# Patient Record
Sex: Female | Born: 1960 | Race: White | Hispanic: No | Marital: Married | Smoking: Never smoker
Health system: Southern US, Community
[De-identification: ages and names within clinical notes are randomized; demographics above are authoritative.]

## PROBLEM LIST (undated history)

## (undated) DIAGNOSIS — E669 Obesity, unspecified: Secondary | ICD-10-CM

## (undated) DIAGNOSIS — K5909 Other constipation: Secondary | ICD-10-CM

## (undated) DIAGNOSIS — F329 Major depressive disorder, single episode, unspecified: Secondary | ICD-10-CM

## (undated) DIAGNOSIS — G4733 Obstructive sleep apnea (adult) (pediatric): Principal | ICD-10-CM

## (undated) DIAGNOSIS — C801 Malignant (primary) neoplasm, unspecified: Secondary | ICD-10-CM

## (undated) DIAGNOSIS — E785 Hyperlipidemia, unspecified: Secondary | ICD-10-CM

## (undated) DIAGNOSIS — F32A Depression, unspecified: Secondary | ICD-10-CM

## (undated) DIAGNOSIS — K589 Irritable bowel syndrome without diarrhea: Secondary | ICD-10-CM

## (undated) DIAGNOSIS — F4010 Social phobia, unspecified: Secondary | ICD-10-CM

## (undated) HISTORY — DX: Hyperlipidemia, unspecified: E78.5

## (undated) HISTORY — DX: Obesity, unspecified: E66.9

## (undated) HISTORY — DX: Irritable bowel syndrome, unspecified: K58.9

## (undated) HISTORY — PX: TOTAL THYROIDECTOMY: SHX2547

## (undated) HISTORY — DX: Major depressive disorder, single episode, unspecified: F32.9

## (undated) HISTORY — DX: Obstructive sleep apnea (adult) (pediatric): G47.33

## (undated) HISTORY — PX: TOTAL ABDOMINAL HYSTERECTOMY: SHX209

## (undated) HISTORY — PX: CYSTOCELE REPAIR: SHX163

## (undated) HISTORY — DX: Social phobia, unspecified: F40.10

## (undated) HISTORY — PX: FOOT SURGERY: SHX648

## (undated) HISTORY — DX: Depression, unspecified: F32.A

## (undated) HISTORY — DX: Other constipation: K59.09

## (undated) HISTORY — DX: Malignant (primary) neoplasm, unspecified: C80.1

## (undated) HISTORY — PX: RECTOCELE REPAIR: SHX761

---

## 1997-10-14 ENCOUNTER — Other Ambulatory Visit: Admission: RE | Admit: 1997-10-14 | Discharge: 1997-10-14 | Payer: Self-pay | Admitting: Obstetrics & Gynecology

## 2000-03-22 DIAGNOSIS — K6389 Other specified diseases of intestine: Secondary | ICD-10-CM

## 2002-03-27 ENCOUNTER — Other Ambulatory Visit: Admission: RE | Admit: 2002-03-27 | Discharge: 2002-03-27 | Payer: Self-pay | Admitting: Obstetrics and Gynecology

## 2002-10-17 ENCOUNTER — Encounter: Payer: Self-pay | Admitting: Obstetrics and Gynecology

## 2002-10-17 ENCOUNTER — Ambulatory Visit (HOSPITAL_COMMUNITY): Admission: RE | Admit: 2002-10-17 | Discharge: 2002-10-17 | Payer: Self-pay | Admitting: Obstetrics and Gynecology

## 2003-04-03 ENCOUNTER — Other Ambulatory Visit: Admission: RE | Admit: 2003-04-03 | Discharge: 2003-04-03 | Payer: Self-pay | Admitting: Obstetrics and Gynecology

## 2003-04-10 ENCOUNTER — Encounter: Admission: RE | Admit: 2003-04-10 | Discharge: 2003-04-10 | Payer: Self-pay | Admitting: Obstetrics and Gynecology

## 2003-09-10 ENCOUNTER — Encounter: Admission: RE | Admit: 2003-09-10 | Discharge: 2003-09-10 | Payer: Self-pay | Admitting: Interventional Radiology

## 2003-11-06 ENCOUNTER — Encounter: Admission: RE | Admit: 2003-11-06 | Discharge: 2003-11-06 | Payer: Self-pay | Admitting: Obstetrics and Gynecology

## 2004-05-05 ENCOUNTER — Other Ambulatory Visit: Admission: RE | Admit: 2004-05-05 | Discharge: 2004-05-05 | Payer: Self-pay | Admitting: Obstetrics and Gynecology

## 2004-05-25 ENCOUNTER — Ambulatory Visit: Payer: Self-pay | Admitting: Gastroenterology

## 2004-05-28 ENCOUNTER — Ambulatory Visit (HOSPITAL_COMMUNITY): Admission: RE | Admit: 2004-05-28 | Discharge: 2004-05-28 | Payer: Self-pay | Admitting: Gastroenterology

## 2004-06-16 ENCOUNTER — Ambulatory Visit: Payer: Self-pay | Admitting: Gastroenterology

## 2004-06-23 ENCOUNTER — Ambulatory Visit: Payer: Self-pay | Admitting: Gastroenterology

## 2004-06-23 DIAGNOSIS — K449 Diaphragmatic hernia without obstruction or gangrene: Secondary | ICD-10-CM | POA: Insufficient documentation

## 2004-09-09 ENCOUNTER — Other Ambulatory Visit: Admission: RE | Admit: 2004-09-09 | Discharge: 2004-09-09 | Payer: Self-pay | Admitting: Obstetrics and Gynecology

## 2006-10-19 ENCOUNTER — Ambulatory Visit: Payer: Self-pay | Admitting: Internal Medicine

## 2006-10-24 ENCOUNTER — Ambulatory Visit (HOSPITAL_COMMUNITY): Admission: RE | Admit: 2006-10-24 | Discharge: 2006-10-24 | Payer: Self-pay | Admitting: Internal Medicine

## 2006-11-17 ENCOUNTER — Ambulatory Visit: Payer: Self-pay | Admitting: Internal Medicine

## 2006-11-28 ENCOUNTER — Ambulatory Visit: Payer: Self-pay | Admitting: Internal Medicine

## 2006-11-28 DIAGNOSIS — K5730 Diverticulosis of large intestine without perforation or abscess without bleeding: Secondary | ICD-10-CM | POA: Insufficient documentation

## 2007-06-05 DIAGNOSIS — R1310 Dysphagia, unspecified: Secondary | ICD-10-CM | POA: Insufficient documentation

## 2007-06-05 DIAGNOSIS — F329 Major depressive disorder, single episode, unspecified: Secondary | ICD-10-CM

## 2007-06-05 DIAGNOSIS — E785 Hyperlipidemia, unspecified: Secondary | ICD-10-CM

## 2007-06-05 DIAGNOSIS — K5900 Constipation, unspecified: Secondary | ICD-10-CM | POA: Insufficient documentation

## 2007-06-05 DIAGNOSIS — K219 Gastro-esophageal reflux disease without esophagitis: Secondary | ICD-10-CM | POA: Insufficient documentation

## 2007-07-28 ENCOUNTER — Telehealth: Payer: Self-pay | Admitting: Internal Medicine

## 2007-09-04 ENCOUNTER — Encounter: Admission: RE | Admit: 2007-09-04 | Discharge: 2007-09-04 | Payer: Self-pay | Admitting: Obstetrics and Gynecology

## 2007-09-07 DIAGNOSIS — K649 Unspecified hemorrhoids: Secondary | ICD-10-CM | POA: Insufficient documentation

## 2007-09-11 ENCOUNTER — Ambulatory Visit: Payer: Self-pay | Admitting: Internal Medicine

## 2007-09-26 ENCOUNTER — Encounter: Payer: Self-pay | Admitting: Internal Medicine

## 2007-09-29 ENCOUNTER — Ambulatory Visit: Payer: Self-pay | Admitting: Internal Medicine

## 2007-09-29 LAB — CONVERTED CEMR LAB
Fecal Occult Blood: NEGATIVE
OCCULT 5: NEGATIVE

## 2007-10-18 ENCOUNTER — Ambulatory Visit: Payer: Self-pay | Admitting: Internal Medicine

## 2007-10-22 ENCOUNTER — Encounter: Payer: Self-pay | Admitting: Internal Medicine

## 2009-02-12 ENCOUNTER — Emergency Department (HOSPITAL_COMMUNITY): Admission: EM | Admit: 2009-02-12 | Discharge: 2009-02-12 | Payer: Self-pay | Admitting: Emergency Medicine

## 2009-04-10 ENCOUNTER — Encounter (INDEPENDENT_AMBULATORY_CARE_PROVIDER_SITE_OTHER): Payer: Self-pay | Admitting: *Deleted

## 2009-04-21 ENCOUNTER — Encounter: Admission: RE | Admit: 2009-04-21 | Discharge: 2009-04-21 | Payer: Self-pay | Admitting: Obstetrics and Gynecology

## 2009-06-12 ENCOUNTER — Encounter
Admission: RE | Admit: 2009-06-12 | Discharge: 2009-06-12 | Payer: Self-pay | Admitting: Physical Medicine and Rehabilitation

## 2009-07-17 ENCOUNTER — Encounter: Admission: RE | Admit: 2009-07-17 | Discharge: 2009-07-17 | Payer: Self-pay | Admitting: Obstetrics and Gynecology

## 2010-03-31 NOTE — Letter (Signed)
Summary: Appointment - Missed  [facility], Main Office  [address]   [place], [place] [address]   Phone: [phone]  Fax: [phone]     April 10, 2009 MRN: [id number]   [facility] [address] [place], [place]  [address]   Dear Ms. CAWLEY,  Our records indicate you missed your appointment on  04/07/2009 with Dr. Tenny Craw. It is very important that we reach you to reschedule this appointment. We look forward to participating in your health care needs. Please contact us at the number listed above at your earliest convenience to reschedule this appointment.     Sincerely,   Migdalia Dk [facility] Scheduling Team

## 2010-07-14 NOTE — Assessment & Plan Note (Signed)
[facility]                         GASTROENTEROLOGY OFFICE NOTE   HARA, MILHOLLAND                        MRN:          [id number]  DATE:10/19/2006                            DOB:          06-26-1960    Ms. Tereasa Coop Mendonsa is a 50 year old white female who is here for  evaluation of constipation.  She was seen previously by Dr. Victorino Dike  and also by Dr. Russella Dar.  Colonoscopy in January 2002, showed normal colon  to the cecum with no polyps.  She was treated with Metamucil and milk of  magnesia.  She was subsequently seen by Dr. Victorino Dike in April 2006,  for symptoms of gastroesophageal reflux and an upper endoscopy confirmed  the presence of a hiatal hernia.  She now complains of progressive  constipation with severe problems with evacuation.  For the past 7-8  months she has not had any spontaneous bowel movements going without a  BM for about a week at a time.  Her weight has increased about 6 pounds.  She has been perimenopausal.  The patient has used health food brand of  stimulant, Triphalia, 1-2 pills a day.  She also uses Dulcolax tablets  once or twice a week which usually is effective within 24 hours of  taking it.  She tried fiber, MiraLax, Senokot.  Her eating habits are  regular, 3 meals a day.  She has a sedentary job at the computer.  She  denies rectal bleeding.   MEDICATIONS:  1. Lipitor 20 mg p.o. daily.  2. Prilosec OTC.  3. Nexium 40 mg p.o. daily.  4. Cymbalta.   PHYSICAL EXAMINATION:  VITAL SIGNS:  Blood pressure 102/66, pulse 92,  and weight 167 pounds.  GENERAL:  The patient was alert, oriented, and in no distress.  SKIN:  Warm and dry.  LUNGS:  Clear to auscultation.  COR:  With a normal S1, normal S2.  HEENT:  Thyroid was normal.  ABDOMEN:  Soft, nontender, not distended with normoactive bowel sounds.  Liver edge at the costal margin and no scars.  RECTAL:  Soft hemoccult negative stool.  Rectal tone was normal.   There  was no prolapse.   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  Rule out pelvic  floor dysfunction.  There was no clinical evidence for a rectal  prolapse.  Her life is quite sedentary which may be contributing to the  constipation.   PLAN:  1. Sitz Marks  to assess the colonic transit.  2. High fiber diet.  3. Resume MiraLax 17 grams daily in the morning and Senokot 2 tablets      at bedtime.  4. She will return in 6-8 weeks.     Hedwig Morton. Juanda Chance, MD  Electronically Signed    DMB/MedQ  DD: 10/20/2006  DT: 10/21/2006  Job #: [id number]   cc:   Dineen Kid. Rana Snare, M.D.

## 2010-10-27 ENCOUNTER — Other Ambulatory Visit: Payer: Self-pay | Admitting: Otolaryngology

## 2010-10-27 DIAGNOSIS — E039 Hypothyroidism, unspecified: Secondary | ICD-10-CM

## 2010-10-30 ENCOUNTER — Ambulatory Visit
Admission: RE | Admit: 2010-10-30 | Discharge: 2010-10-30 | Disposition: A | Discharge status: Home / Self Care | Payer: BC Managed Care – PPO | Source: Ambulatory Visit | Attending: Otolaryngology | Admitting: Otolaryngology

## 2010-10-30 DIAGNOSIS — E039 Hypothyroidism, unspecified: Secondary | ICD-10-CM

## 2010-11-10 ENCOUNTER — Other Ambulatory Visit: Payer: Self-pay | Admitting: Otolaryngology

## 2010-11-10 DIAGNOSIS — E041 Nontoxic single thyroid nodule: Secondary | ICD-10-CM

## 2010-11-16 ENCOUNTER — Other Ambulatory Visit (HOSPITAL_COMMUNITY)
Admission: RE | Admit: 2010-11-16 | Discharge: 2010-11-16 | Disposition: A | Discharge status: Home / Self Care | Payer: BC Managed Care – PPO | Source: Ambulatory Visit | Attending: Interventional Radiology | Admitting: Interventional Radiology

## 2010-11-16 ENCOUNTER — Ambulatory Visit
Admission: RE | Admit: 2010-11-16 | Discharge: 2010-11-16 | Disposition: A | Discharge status: Home / Self Care | Payer: BC Managed Care – PPO | Source: Ambulatory Visit | Attending: Otolaryngology | Admitting: Otolaryngology

## 2010-11-16 DIAGNOSIS — E041 Nontoxic single thyroid nodule: Secondary | ICD-10-CM

## 2010-11-16 DIAGNOSIS — E049 Nontoxic goiter, unspecified: Secondary | ICD-10-CM | POA: Insufficient documentation

## 2012-09-06 ENCOUNTER — Ambulatory Visit
Admission: RE | Admit: 2012-09-06 | Discharge: 2012-09-06 | Disposition: A | Discharge status: Home / Self Care | Payer: BC Managed Care – PPO | Source: Ambulatory Visit | Attending: Family Medicine | Admitting: Family Medicine

## 2012-09-06 ENCOUNTER — Other Ambulatory Visit: Payer: Self-pay | Admitting: Family Medicine

## 2012-09-06 DIAGNOSIS — K5900 Constipation, unspecified: Secondary | ICD-10-CM

## 2013-11-16 ENCOUNTER — Encounter: Payer: Self-pay | Admitting: *Deleted

## 2014-01-07 ENCOUNTER — Other Ambulatory Visit: Payer: Self-pay | Admitting: Dermatology

## 2014-06-14 ENCOUNTER — Encounter: Payer: Self-pay | Admitting: Interventional Cardiology

## 2014-06-14 ENCOUNTER — Ambulatory Visit (INDEPENDENT_AMBULATORY_CARE_PROVIDER_SITE_OTHER): Payer: BLUE CROSS/BLUE SHIELD | Admitting: Interventional Cardiology

## 2014-06-14 VITALS — BP 122/70 | HR 101 | Ht 66.0 in | Wt 186.8 lb

## 2014-06-14 DIAGNOSIS — R079 Chest pain, unspecified: Secondary | ICD-10-CM

## 2014-06-14 DIAGNOSIS — R0683 Snoring: Secondary | ICD-10-CM

## 2014-06-14 NOTE — Progress Notes (Signed)
Cardiology Office Note   Date:  06/14/2014   ID:  Jean Kelly, DOB 01-28-1961, MRN [id number]  PCP:  No primary care provider on file.  Cardiologist:   Sinclair Grooms, MD   Chief Complaint  Patient presents with  . Chest Pain      History of Present Illness: Jean Kelly is a 54 y.o. female who presents for evaluation of chest pain. She has had prior stress EKGs done in the past. She is cared for by Dr. Antony Contras. Over the past 4 months she has had 4 episodes of chest discomfort described as crushing. Episodes last 15-20 minutes. There is radiation into the neck and into each arm. Episodes have occurred during the daytime. Episodes gradually resolve. There is no sense of her heart racing or other complaints. She is pain-free currently. Last episode occurred 2 weeks ago. Intensity is 6/10.  Patient complains of a multiyear history of excessive daytime sleepiness and snoring. She states that the daytime sleepiness is particularly severe recently, with her falling asleep during a phone conference recently. She believes she awakened because she started snoring. The patient uses Adderall each morning to help her stay awake. She has done this for years.    Past Medical History  Diagnosis Date  . Depression   . Social phobia   . Chronic constipation   . Hyperlipidemia   . IBS (irritable bowel syndrome)   . Cancer     papillary thyroid    Past Surgical History  Procedure Laterality Date  . Total abdominal hysterectomy    . Rectocele repair    . Cystocele repair    . Foot surgery    . Total thyroidectomy       Current Outpatient Prescriptions  Medication Sig Dispense Refill  . Amphetamine-Dextroamphetamine (AMPHETAMINE SALT COMBO PO) Take 20 mg by mouth daily.     Marland Kitchen atorvastatin (LIPITOR) 40 MG tablet Take 40 mg by mouth daily.    Marland Kitchen co-enzyme Q-10 30 MG capsule Take 30 mg by mouth 3 (three) times daily.    Marland Kitchen docusate sodium (COLACE) 100 MG capsule Take 100 mg by  mouth 2 (two) times daily.    . DULoxetine (CYMBALTA) 30 MG capsule Take 30 mg by mouth 3 (three) times daily.     Marland Kitchen levothyroxine (SYNTHROID, LEVOTHROID) 137 MCG tablet Take 137 mcg by mouth daily before breakfast.    . Probiotic Product (PROBIOTIC DAILY PO) Take by mouth.     No current facility-administered medications for this visit.    Allergies:   Amoxicillin and Penicillins    Social History:  The patient  reports that she has never smoked. She does not have any smokeless tobacco history on file.   Family History:  The patient's  Father CAD with stentfamily history includes Lung disease in her mother; Parkinson's disease in her mother; Stroke in her father.    ROS:  Please see the history of present illness.   Otherwise, review of systems are positive for snoring and excessive daytime sleepiness. Other concerns include visual disturbance, abdominal discomfort, depression, muscle aches, rash, excessive sweating and fatigue, anxiety, and constipation.   All other systems are reviewed and negative.    PHYSICAL EXAM: VS:  BP 122/70 mmHg  Pulse 101  Ht 5\' 6"  (1.676 m)  Wt 186 lb 12.8 oz (84.732 kg)  BMI 30.16 kg/m2 , BMI Body mass index is 30.16 kg/(m^2). GEN: Well nourished, well developed, in no acute distress HEENT: normal  Neck: no JVD, carotid bruits, or masses Cardiac: RRR; no murmurs, rubs, or gallops,no edema  Respiratory:  clear to auscultation bilaterally, normal work of breathing GI: soft, nontender, nondistended, + BS MS: no deformity or atrophy Skin: warm and dry, no rash Neuro:  Strength and sensation are intact Psych: euthymic mood, full affect   EKG:  EKG is ordered today. The ekg ordered today demonstrates sinus tachycardia with nonspecific ST abnormality area no significant change compared to baseline EKG prior to exercise testing in 2012   Recent Labs: No results found for requested labs within last 365 days.    Lipid Panel No results found for: CHOL,  TRIG, HDL, CHOLHDL, VLDL, LDLCALC, LDLDIRECT    Wt Readings from Last 3 Encounters:  06/14/14 186 lb 12.8 oz (84.732 kg)  09/11/07 153 lb 4 oz (69.514 kg)      Other studies Reviewed: Additional studies/ records that were reviewed today include: [facility] records including the most recent treadmill. Review of the above records demonstrates: No evidence of ischemia was noted in 2012   ASSESSMENT AND PLAN:  Chest pain at rest: The chest discomfort is atypical, occurring at rest but with features that are concerning.  Snoring: This is associated with excessive daytime sleepiness. Sleep apnea is a consideration.  Excessive daytime sleepiness: The patient uses Adderall each morning to stay awake. This is prescribed by her psychiatrist, Dr. Toy Care.  Current medicines are reviewed at length with the patient today.  The patient does not have concerns regarding medicines.  The following changes have been made:  In this situation, I am concerned about the use of amphetamine, and that it may be precipitating spells of coronary artery spasm.   Labs/ tests ordered today include:  No orders of the defined types were placed in this encounter.     Disposition:   FU with Linard Millers in PRN    Signed, Sinclair Grooms, MD  06/14/2014 11:51 AM    [address], [place], [place]  [address] Phone: [phone]; Fax: [phone]

## 2014-06-14 NOTE — Patient Instructions (Addendum)
Medication Instructions:  Your physician recommends that you continue on your current medications as directed. Please refer to the Current Medication list given to you today.  Labwork: No new orders.  Testing/Procedures: Your physician has requested that you have an exercise tolerance test. For further information please visit [email or web address]. Please also follow instruction sheet, as given.  Your physician has recommended that you have a sleep study. This test records several body functions during sleep, including: brain activity, eye movement, oxygen and carbon dioxide blood levels, heart rate and rhythm, breathing rate and rhythm, the flow of air through your mouth and nose, snoring, body muscle movements, and chest and belly movement.  Follow-Up: Your physician recommends that you schedule a follow-up appointment as needed with Dr Tamala Julian.    Any Other Special Instructions Will Be Listed Below (If Applicable).

## 2014-07-15 ENCOUNTER — Encounter: Payer: BLUE CROSS/BLUE SHIELD | Admitting: Physician Assistant

## 2014-07-15 ENCOUNTER — Ambulatory Visit (INDEPENDENT_AMBULATORY_CARE_PROVIDER_SITE_OTHER): Payer: BLUE CROSS/BLUE SHIELD

## 2014-07-15 DIAGNOSIS — R079 Chest pain, unspecified: Secondary | ICD-10-CM

## 2014-07-15 LAB — EXERCISE TOLERANCE TEST
CHL CUP STRESS STAGE 1 HR: 107 {beats}/min
CHL CUP STRESS STAGE 1 SBP: 135 mmHg
CHL CUP STRESS STAGE 2 SPEED: 1 mph
CHL CUP STRESS STAGE 3 GRADE: 0 %
CHL CUP STRESS STAGE 4 DBP: 71 mmHg
CHL CUP STRESS STAGE 4 GRADE: 10 %
CHL CUP STRESS STAGE 4 SBP: 158 mmHg
CHL CUP STRESS STAGE 5 DBP: 70 mmHg
CHL CUP STRESS STAGE 5 GRADE: 12 %
CHL CUP STRESS STAGE 6 SPEED: 3.4 mph
CHL CUP STRESS STAGE 7 HR: 146 {beats}/min
CHL CUP STRESS STAGE 7 SPEED: 1.5 mph
CHL CUP STRESS STAGE 8 DBP: 98 mmHg
CHL CUP STRESS STAGE 8 SBP: 135 mmHg
CHL CUP STRESS STAGE 8 SPEED: 0 mph
CSEPHR: 93 %
CSEPPBP: 153 mmHg
CSEPPHR: 153 {beats}/min
Estimated workload: 8.5 METS
Exercise duration (min): 7 min
Exercise duration (sec): 1 s
MPHR: 165 {beats}/min
Percent of predicted max HR: 92 %
RPE: 19
Rest HR: 98 {beats}/min
Stage 1 DBP: 99 mmHg
Stage 1 Grade: 0 %
Stage 1 Speed: 0 mph
Stage 2 Grade: 0 %
Stage 2 HR: 105 {beats}/min
Stage 3 HR: 105 {beats}/min
Stage 3 Speed: 1 mph
Stage 4 HR: 126 {beats}/min
Stage 4 Speed: 1.7 mph
Stage 5 HR: 146 {beats}/min
Stage 5 SBP: 150 mmHg
Stage 5 Speed: 2.5 mph
Stage 6 DBP: 75 mmHg
Stage 6 Grade: 14 %
Stage 6 HR: 153 {beats}/min
Stage 6 SBP: 153 mmHg
Stage 7 DBP: 75 mmHg
Stage 7 Grade: 0 %
Stage 7 SBP: 151 mmHg
Stage 8 Grade: 0 %
Stage 8 HR: 113 {beats}/min

## 2014-07-16 ENCOUNTER — Telehealth: Payer: Self-pay

## 2014-07-16 NOTE — Telephone Encounter (Signed)
Pt aware of gxt results. No problems noted on treadmill Pt verbalized understanding.

## 2014-07-16 NOTE — Telephone Encounter (Signed)
-----   Message from Belva Crome, MD sent at 07/15/2014  6:19 PM EDT ----- No problems noted on treadmill

## 2014-08-23 ENCOUNTER — Ambulatory Visit (HOSPITAL_BASED_OUTPATIENT_CLINIC_OR_DEPARTMENT_OTHER): Payer: BLUE CROSS/BLUE SHIELD | Attending: Interventional Cardiology

## 2014-08-23 VITALS — Ht 66.5 in | Wt 185.0 lb

## 2014-08-23 DIAGNOSIS — G4733 Obstructive sleep apnea (adult) (pediatric): Secondary | ICD-10-CM | POA: Diagnosis not present

## 2014-08-23 DIAGNOSIS — G4719 Other hypersomnia: Secondary | ICD-10-CM

## 2014-08-23 DIAGNOSIS — R0683 Snoring: Secondary | ICD-10-CM | POA: Diagnosis not present

## 2014-08-25 ENCOUNTER — Telehealth: Payer: Self-pay | Admitting: Cardiology

## 2014-08-25 DIAGNOSIS — G4719 Other hypersomnia: Secondary | ICD-10-CM | POA: Insufficient documentation

## 2014-08-25 DIAGNOSIS — G4733 Obstructive sleep apnea (adult) (pediatric): Secondary | ICD-10-CM | POA: Insufficient documentation

## 2014-08-25 HISTORY — DX: Obstructive sleep apnea (adult) (pediatric): G47.33

## 2014-08-25 NOTE — Telephone Encounter (Signed)
Please let patient know that they have sleep apnea and recommend CPAP titration. Please set up titration in the sleep lab. 

## 2014-08-25 NOTE — Sleep Study (Signed)
   NAME: Jean Kelly DATE OF BIRTH:  1960-07-09 MEDICAL RECORD NUMBER [id number]  LOCATION: [facility]  PHYSICIAN: Ammi Hutt R  DATE OF STUDY: 08/23/2014  SLEEP STUDY TYPE: Nocturnal Polysomnogram               REFERRING PHYSICIAN: Belva Crome, MD  INDICATION FOR STUDY: Excessive daytime sleepiness  EPWORTH SLEEPINESS SCORE: 17 HEIGHT: 5' 6.5" (168.9 cm)  WEIGHT: 185 lb (83.915 kg)    Body mass index is 29.42 kg/(m^2).  NECK SIZE: 14.75 in.  MEDICATIONS: Reviewed in the chart.  SLEEP ARCHITECTURE: The patient slept for a total of 289 minutes out of a total sleep period time of 376 minutes.  There was 0.5 minutes of slow wave sleep and 17 minutes of REM sleep.  The onset to sleep latency was 1 minute and the onset to REM sleep latency was prolonged at 248 minutes.  The sleep efficiency was reduced at 76%.    RESPIRATORY DATA: There were 7 apneas, of which, 2 were obstructive and 5 were central.  There were 43 hypopneas.  The overall AHI was 10.4 events per hour consistent with mild obstructive sleep apnea/hypopnea syndrome.  Most events occurred during NREM sleep in the non-supine position.  There was moderate to severe snoring noted.  OXYGEN DATA: The average oxygen saturation was 91%.  The lowest oxygen saturation was 86%.  The time spent with oxygen saturations < 88% was 6.4 minutes.    CARDIAC DATA: The patient maintained NSR throughout the study with an average heart rate of 78 bpm.  The lowest heart rate was 33 bpm.  MOVEMENT/PARASOMNIA: There were REM sleep behavior disorders.  There were an increased number of periodic limb movements with a PLMS index of 23 movements per hour.    IMPRESSION/ RECOMMENDATION:   1. Mild obstructive sleep apnea/hypopnea syndrome with an AHI of 10.4 events per hour.  Most events occurred during NREM sleep in the non-supine position.  2.  Moderate to severe snoring was noted. 3.  Reduced sleep efficiency with increased  frequency of awakenings secondary to respiratory events and spontaneous events.   4.  Abnormal sleep architecture with prolonged onset to REM sleep. 5.  Oxygen desaturations with respiratory events as low as 86%.  The time spent with oxygen saturations < 88% was 6.4 minutes.   6.  Given degree of excessive daytime sleepiness with elevated Epworth sleepiness scale, moderate to severe snoring and significant oxygen desaturations with sleep disordered breathing, CPAP titration would be appropriate.  7.  Treatment would also include careful attention to proper sleep hygiene,  avoidance of sleeping in the supine position and avoidance of alcohol within four hours of bedtime.  Specific treatment decisions should be tailored to each patient based upon the clinical situation and all treatment options should be considered.  The patient should be instructed to avoid driving if sleepy and careful clinical follow up is needed to ensure that the patient's symptoms are improving with therapy and the PAP adherence is supported and measured if prescribed.    Signed:  Sueanne Margarita Diplomate, American Board of Sleep Medicine  ELECTRONICALLY SIGNED ON:  08/25/2014, 6:33 PM [place] PH: [phone]   FX: [phone] [place]

## 2014-08-27 NOTE — Telephone Encounter (Signed)
Left message to call back  

## 2014-08-29 ENCOUNTER — Other Ambulatory Visit: Payer: Self-pay | Admitting: *Deleted

## 2014-08-29 DIAGNOSIS — G4733 Obstructive sleep apnea (adult) (pediatric): Secondary | ICD-10-CM

## 2014-08-29 NOTE — Telephone Encounter (Signed)
Patient is aware of results.   CPAP Titration scheduled - per patient request, I have emailed her the date/time

## 2014-11-06 ENCOUNTER — Ambulatory Visit
Admission: RE | Admit: 2014-11-06 | Discharge: 2014-11-06 | Disposition: A | Discharge status: Home / Self Care | Payer: BLUE CROSS/BLUE SHIELD | Source: Ambulatory Visit | Attending: Family Medicine | Admitting: Family Medicine

## 2014-11-06 ENCOUNTER — Other Ambulatory Visit: Payer: Self-pay | Admitting: Family Medicine

## 2014-11-06 DIAGNOSIS — R05 Cough: Secondary | ICD-10-CM

## 2014-11-06 DIAGNOSIS — R059 Cough, unspecified: Secondary | ICD-10-CM

## 2014-11-07 ENCOUNTER — Other Ambulatory Visit: Payer: Self-pay | Admitting: Obstetrics and Gynecology

## 2014-11-08 LAB — CYTOLOGY - PAP

## 2014-11-20 ENCOUNTER — Ambulatory Visit (HOSPITAL_BASED_OUTPATIENT_CLINIC_OR_DEPARTMENT_OTHER): Payer: BLUE CROSS/BLUE SHIELD | Attending: Cardiology

## 2014-11-20 VITALS — Ht 67.0 in | Wt 195.0 lb

## 2014-11-20 DIAGNOSIS — G4730 Sleep apnea, unspecified: Secondary | ICD-10-CM | POA: Diagnosis not present

## 2014-11-20 DIAGNOSIS — G4733 Obstructive sleep apnea (adult) (pediatric): Secondary | ICD-10-CM | POA: Diagnosis not present

## 2014-11-26 ENCOUNTER — Telehealth: Payer: Self-pay | Admitting: Cardiology

## 2014-11-26 NOTE — Progress Notes (Signed)
   Patient Name: Jean Kelly, Jean Kelly Date: 11/20/2014 MRN: [id number] Gender: Female D.O.B: September 15, 1960 Age (years): 62 Referring Provider: Fransico Him MD, ABSM Interpreting Physician: Fransico Him MD, ABSM RPSGT: Baxter Flattery  Weight (lbs): 195 Height (inches): 67 BMI: 31 Neck Size: 14.00  CLINICAL INFORMATION The patient is referred for a CPAP titration to treat sleep apnea.  Date of NPSG, Split Night or HST:08/25/2014  SLEEP STUDY TECHNIQUE As per the AASM Manual for the Scoring of Sleep and Associated Events v2.3 (April 2016) with a hypopnea requiring 4% desaturations.  The channels recorded and monitored were frontal, central and occipital EEG, electrooculogram (EOG), submentalis EMG (chin), nasal and oral airflow, thoracic and abdominal wall motion, anterior tibialis EMG, snore microphone, electrocardiogram, and pulse oximetry. Continuous positive airway pressure (CPAP) was initiated at the beginning of the study and titrated to treat sleep-disordered breathing.  MEDICATIONS Medications taken by the patient : Lipitor, Cymbalta, Synthroid, amphetamine-dextroamphetamine Medications administered by patient during sleep study : No sleep medicine administered.  TECHNICIAN COMMENTS Comments added by technician: Patient talked in his/her sleep.  Comments added by scorer: N/A  RESPIRATORY PARAMETERS Optimal PAP Pressure (cm): 13  AHI at Optimal Pressure (/hr):0.0 Overall Minimal O2 (%):85.00   Supine % at Optimal Pressure (%):100 Minimal O2 at Optimal Pressure (%):94.0    SLEEP ARCHITECTURE The study was initiated at 9:59:52 PM and ended at 4:54:17 AM.  Sleep onset time was 23.1 minutes and the sleep efficiency was reduced at 63.0%. The total sleep time was 261.0 minutes.  The patient spent 8.43% of the night in stage N1 sleep, 60.73% in stage N2 sleep, 0.00% in stage N3 and 30.84% in REM.Stage REM latency was 303.5 minutes  Wake after sleep onset was 130.3. Alpha  intrusion was absent. Supine sleep was 75.96%.  CARDIAC DATA The 2 lead EKG demonstrated sinus rhythm. The mean heart rate was 62.06 beats per minute. Other EKG findings include: None.  LEG MOVEMENT DATA The total Periodic Limb Movements of Sleep (PLMS) were 119. The PLMS index was 27.36. A PLMS index of <15 is considered normal in adults.  IMPRESSIONS The optimal PAP pressure was 13 cm of water. Central sleep apnea was not noted during this titration (CAI = 4.1/h). Moderate oxygen desaturations were observed during this titration (min O2 = 85.00%). No snoring was audible during this study. No cardiac abnormalities were observed during this study. Moderate periodic limb movements were observed during this study. Arousals associated with PLMs were rare.  DIAGNOSIS Obstructive Sleep Apnea (327.23 [G47.33 ICD-10])  RECOMMENDATIONS Trial of CPAP therapy on 13 cm H2O with a Small size Fisher&Paykel Full Face Mask Simplus mask and heated humidification. Avoid alcohol, sedatives and other CNS depressants that may worsen sleep apnea and disrupt normal sleep architecture. Sleep hygiene should be reviewed to assess factors that may improve sleep quality. Weight management and regular exercise should be initiated or continued. Return to Sleep Center for re-evaluation after 4 weeks of therapy   [place], American Board of Sleep Medicine  ELECTRONICALLY SIGNED ON:  11/26/2014, 7:17 PM [place] PH: [phone]   FX: [phone] [place]

## 2014-11-26 NOTE — Telephone Encounter (Signed)
Pt had successful PAP titration. Please setup appointment in 10 weeks. Please let [facility] know that order for PAP is in EPIC.   

## 2014-11-26 NOTE — Addendum Note (Signed)
Addended by: Sueanne Margarita on: 11/26/2014 07:22 PM   Modules accepted: Orders

## 2014-11-27 NOTE — Telephone Encounter (Signed)
Patient is aware of results. Stated verbal understanding.  Once she is set up with her machine I will schedule 10 week follow-up.  Gordon Notified of orders.

## 2015-03-11 ENCOUNTER — Encounter: Payer: Self-pay | Admitting: Cardiology

## 2015-03-18 ENCOUNTER — Institutional Professional Consult (permissible substitution): Payer: BLUE CROSS/BLUE SHIELD | Admitting: Internal Medicine

## 2015-04-07 ENCOUNTER — Encounter: Payer: Self-pay | Admitting: Internal Medicine

## 2015-04-07 ENCOUNTER — Ambulatory Visit (INDEPENDENT_AMBULATORY_CARE_PROVIDER_SITE_OTHER): Payer: BLUE CROSS/BLUE SHIELD | Admitting: Internal Medicine

## 2015-04-07 VITALS — BP 136/88 | HR 99 | Ht 66.5 in | Wt 191.2 lb

## 2015-04-07 DIAGNOSIS — K219 Gastro-esophageal reflux disease without esophagitis: Secondary | ICD-10-CM | POA: Diagnosis not present

## 2015-04-07 DIAGNOSIS — R058 Other specified cough: Secondary | ICD-10-CM

## 2015-04-07 DIAGNOSIS — R05 Cough: Secondary | ICD-10-CM | POA: Diagnosis not present

## 2015-04-07 NOTE — Patient Instructions (Signed)
Protonix Take 30- 60 min before your first and last meals of the day   GERD (REFLUX)  is an extremely common cause of respiratory symptoms just like yours , many times with no obvious heartburn at all.    It can be treated with medication, but also with lifestyle changes including elevation of the head of your bed (ideally with 6 inch  bed blocks),  Smoking cessation, avoidance of late meals, excessive alcohol, and avoid fatty foods, chocolate, peppermint, colas, red wine, and acidic juices such as orange juice.  NO MINT OR MENTHOL PRODUCTS SO NO COUGH DROPS  USE SUGARLESS CANDY INSTEAD (Jolley ranchers or Stover's or Life Savers) or even ice chips will also do - the key is to swallow to prevent all throat clearing. NO OIL BASED VITAMINS - use powdered substitutes.  For drainage sensation  / throat tickle ok to  try  CHLORPHENIRAMINE  4 mg - take one every 4 hours as needed - available over the counter- may cause drowsiness so start with just a bedtime dose or two and see how you tolerate it before trying in daytime     If not better return in 6 weeks

## 2015-04-07 NOTE — Assessment & Plan Note (Signed)
The most common causes of chronic cough in immunocompetent adults include the following: upper airway cough syndrome (UACS), previously referred to as postnasal drip syndrome (PNDS), which is caused by variety of rhinosinus conditions; (2) asthma; (3) GERD; (4) chronic bronchitis from cigarette smoking or other inhaled environmental irritants; (5) nonasthmatic eosinophilic bronchitis; and (6) bronchiectasis.   These conditions, singly or in combination, have accounted for up to 94% of the causes of chronic cough in prospective studies.   Other conditions have constituted no >6% of the causes in prospective studies These have included bronchogenic carcinoma, chronic interstitial pneumonia, sarcoidosis, left ventricular failure, ACEI-induced cough, and aspiration from a condition associated with pharyngeal dysfunction.    Chronic cough is often simultaneously caused by more than one condition. A single cause has been found from 38 to 82% of the time, multiple causes from 18 to 62%. Multiply caused cough has been the result of three diseases up to 42% of the time.       Based on hx and exam, this is most likely:  Classic Upper airway cough syndrome, so named because it's frequently impossible to sort out how much is  CR/sinusitis with freq throat clearing (which can be related to primary GERD)   vs  causing  secondary (" extra esophageal")  GERD from wide swings in gastric pressure that occur with throat clearing, often  promoting self use of mint and menthol lozenges that reduce the lower esophageal sphincter tone and exacerbate the problem further in a cyclical fashion.   These are the same pts (now being labeled as having "irritable larynx syndrome" by some cough centers) who not infrequently have a history of having failed to tolerate ace inhibitors,  dry powder inhalers or biphosphonates or report having atypical reflux symptoms that don't respond to standard doses of PPI , and are easily confused as  having aecopd or asthma flares by even experienced allergists/ pulmonologists.   The first step is to maximize GERD rx  and eliminate cyclical coughing by avoiding cough drops and using non mint and menthol candies then regroup if the cough persists.  I had an extended discussion with the patient reviewing all relevant studies completed to date and  lasting 35/60 min intial ov  Explained the natural history of uri and why it's necessary in patients at risk to treat GERD aggressively - at least  short term -   to reduce risk of evolving cyclical cough initially  triggered by epithelial injury and a heightened sensitivty to the effects of any upper airway irritants,  most importantly acid - related - then perpetuated by epithelial injury related to the cough itself as the upper airway collapses on itself.  That is, the more sensitive the epithelium becomes once it is damaged by the virus, the more the ensuing irritability> the more the cough, the more the secondary reflux (especially in those prone to reflux) the more the irritation of the sensitive mucosa and so on in a  Classic cyclical pattern which won't go completely away until / unless she stops all coughing   The standardized cough guidelines published in Chest by Lissa Morales in 2006 are still the best available and consist of a multiple step process (up to 12!) , not a single office visit,  and are intended  to address this problem logically,  with an alogrithm dependent on response to empiric treatment at  each progressive step  to determine a specific diagnosis with  minimal addtional testing needed.  If not better with recs will have her return for w/u for allergies/ asthma/sinus dz  though I think that will be very low yield.  Each maintenance medication was reviewed in detail including most importantly the difference between maintenance and as needed and under what circumstances the prns are to be used.  Please see instructions for  details which were reviewed in writing and the patient given a copy.    Total time devoted to counseling  = 35/5m review case with pt/ discussion of options/alternatives/ personally creating in presence of pt  then going over specific  Instructions directly with the pt including how to use all of the meds but in particular covering each new medication in detail (see avs)

## 2015-04-07 NOTE — Progress Notes (Signed)
Subjective:     Patient ID: Jean Kelly, female   DOB: Sep 18, 1960,    MRN: [id number]  HPI  83 yowf never smoker abrupt onset cough during boss's visit Oct 25 2014 rx with zpak and gradually better over next sev months  but flared then  after endoscopy done assoc with sense of globus so referred to pulmonary clinic 04/07/2015 by Dr Moreen Fowler.  Says result of endo was rec bid ppi for "my hernia" per Dr Watt Climes results not in epic   04/07/2015 1st [facility] office visit/ Jean Kelly   Chief Complaint  Patient presents with  . Pulmonary Consult    Referred by Dr. Antony Contras. Pt c/o cough off and on x 2 yrs- usually starts in the Summer. Cough is non prod.   cough more dry than wet and ? Worse at hs/ tessilon didn't help but alkaseltzer did / Note since endohas  Been on PPI but not taking a and cough has improved but not resolved on rxc - note reported to me onset was def in August "never have had a cough like this" though in retrospect with ? Uri's more tendency to persistent/ lingering coughs x 2 y prior to OV    No obvious day to day or daytime variability or assoc sob or  excess/ purulent sputum or mucus plugs   or cp or chest tightness, subjective wheeze or overt sinus or hb symptoms. No unusual exp hx or h/o childhood pna/ asthma or knowledge of premature birth.  Sleeping ok without nocturnal  or early am exacerbation  of respiratory  c/o's or need for noct saba. Also denies any obvious fluctuation of symptoms with weather or environmental changes or other aggravating or alleviating factors except as outlined above   Current Medications, Allergies, Complete Past Medical History, Past Surgical History, Family History, and Social History were reviewed in [employer] record.  ROS  The following are not active complaints unless bolded sore throat, dysphagia, dental problems, itching, sneezing,  nasal congestion or excess/ purulent secretions, ear ache,   fever, chills, sweats,  unintended wt loss, classically pleuritic or exertional cp, hemoptysis,  orthopnea pnd or leg swelling, presyncope, palpitations, abdominal pain, anorexia, nausea, vomiting, diarrhea  or change in bowel or bladder habits, change in stools or urine, dysuria,hematuria,  rash, arthralgias, visual complaints, headache, numbness, weakness or ataxia or problems with walking or coordination,  change in mood/affect or memory.          Review of Systems     Objective:   Physical Exam amb wf nad  Wt Readings from Last 3 Encounters:  04/07/15 191 lb 3.2 oz (86.728 kg)  11/20/14 195 lb (88.451 kg)  08/23/14 185 lb (83.915 kg)    Vital signs reviewed   HEENT: nl dentition, turbinates, and oropharynx. Nl external ear canals without cough reflex   NECK :  without JVD/Nodes/TM/ nl carotid upstrokes bilaterally   LUNGS: no acc muscle use,  Nl contour chest which is clear to A and P bilaterally without cough on insp or exp maneuvers   CV:  RRR  no s3 or murmur or increase in P2, no edema   ABD:  soft and nontender with nl inspiratory excursion in the supine position. No bruits or organomegaly, bowel sounds nl  MS:  Nl gait/ ext warm without deformities, calf tenderness, cyanosis or clubbing No obvious joint restrictions   SKIN: warm and dry without lesions    NEURO:  alert, approp, nl sensorium  with  no motor deficits    I personally reviewed images and agree with radiology impression as follows:  CXR:  11/06/14 Cardiac shadow is within normal limits. The lungs are well aerated bilaterally. No focal infiltrate or sizable effusion is seen. No bony abnormality is noted         Assessment:

## 2015-04-07 NOTE — Assessment & Plan Note (Signed)
Of the three most common causes of chronic cough, only one (GERD)  can actually cause the other two (asthma and post nasal drip syndrome)  and perpetuate the cylce of cough inducing airway trauma, inflammation, heightened sensitivity to reflux which is prompted by the cough itself via a cyclical mechanism.    This may partially respond to steroids and look like asthma and post nasal drainage but never erradicated completely unless the cough and the secondary reflux are eliminated, preferably both at the same time.  While not intuitively obvious, many patients with chronic low grade reflux do not cough until there is a secondary insult that disturbs the protective epithelial barrier and exposes sensitive nerve endings.  This can be viral or direct physical injury such as with an endotracheal tube.   The point is that once this occurs, it is difficult to eliminate using anything but a maximally effective acid suppression regimen at least in the short run, accompanied by an appropriate diet to address non acid GERD. - see avs

## 2015-04-17 ENCOUNTER — Ambulatory Visit
Admission: RE | Admit: 2015-04-17 | Discharge: 2015-04-17 | Disposition: A | Discharge status: Home / Self Care | Payer: BLUE CROSS/BLUE SHIELD | Source: Ambulatory Visit | Attending: Family Medicine | Admitting: Family Medicine

## 2015-04-17 ENCOUNTER — Other Ambulatory Visit: Payer: Self-pay | Admitting: Family Medicine

## 2015-04-17 DIAGNOSIS — J209 Acute bronchitis, unspecified: Secondary | ICD-10-CM

## 2015-05-12 ENCOUNTER — Encounter: Payer: Self-pay | Admitting: Cardiology

## 2015-05-12 DIAGNOSIS — E66811 Obesity, class 1: Secondary | ICD-10-CM

## 2015-05-12 DIAGNOSIS — E669 Obesity, unspecified: Secondary | ICD-10-CM

## 2015-05-12 HISTORY — DX: Obesity, unspecified: E66.9

## 2015-05-12 HISTORY — DX: Obesity, class 1: E66.811

## 2015-05-12 NOTE — Progress Notes (Signed)
Cardiology Office Note   Date:  05/13/2015   ID:  Jean Kelly, DOB Jun 03, 1960, MRN [id number]  PCP:  Gara Kroner, MD    Chief Complaint  Patient presents with  . Sleep Apnea      History of Present Illness: Jean Kelly is a 55 y.o. female who presents for evaluation of OSA.  She was referred by Dr. Tamala Julian for excessive daytime sleepiness with an Epworth Sleepiness Score of 17.  She underwent PSG and was found to have mild OSA with an AHI of 10.4/hr mainly in NREM and in non supine position with moderate to severe snoring.  Oxygen saturations dropped as low as 86% with respiratory events.  The patient underwent CPAP titration to 13cm H2O.  She presents today for followup. She is doing well with her CPAP device.  She tolerates her full face mask and feels the pressure is adequate.  Since starting on CPAP she no longer snores and she feels more rested in the am with less daytime sleepiness.  She has some mouth dryness which she attributes to the benztropine.      Past Medical History  Diagnosis Date  . Depression   . Social phobia   . Chronic constipation   . Hyperlipidemia   . IBS (irritable bowel syndrome)   . Cancer (HCC)     papillary thyroid  . Obesity (BMI 30.0-34.9) 05/12/2015  . OSA (obstructive sleep apnea) 08/25/2014    Mild OSA with an AHI of 10/hr now on CPAP at 13cm H2O     Past Surgical History  Procedure Laterality Date  . Total abdominal hysterectomy    . Rectocele repair    . Cystocele repair    . Foot surgery    . Total thyroidectomy       Current Outpatient Prescriptions  Medication Sig Dispense Refill  . amphetamine-dextroamphetamine (ADDERALL) 20 MG tablet Take 20 mg by mouth 2 (two) times daily.  0  . Amphetamine-Dextroamphetamine (AMPHETAMINE SALT COMBO PO) Take 20 mg by mouth daily.     Marland Kitchen atorvastatin (LIPITOR) 40 MG tablet Take 40 mg by mouth 3 (three) times a week.     Marland Kitchen b complex vitamins tablet Take 1 tablet by mouth  daily.    . benztropine (COGENTIN) 0.5 MG tablet Take 0.5 mg by mouth daily.    . Cholecalciferol (VITAMIN D3) 5000 units CAPS Take 2 capsules by mouth daily.    . DULoxetine (CYMBALTA) 30 MG capsule Take 90 mg by mouth daily.     . potassium gluconate 595 (99 K) MG TABS tablet Take 595 mg by mouth 3 (three) times daily.     Marland Kitchen Ubiquinol 100 MG CAPS Take 1 capsule by mouth daily.    Marland Kitchen UNABLE TO FIND Med Name: Iodine/Potassium supplment daily    . UNABLE TO FIND Med Name: Calcium Citrate with Magnesium daily    . pantoprazole (PROTONIX) 40 MG tablet Take 40 mg by mouth 2 (two) times daily. Reported on 05/13/2015     No current facility-administered medications for this visit.    Allergies:   Amoxicillin and Penicillins    Social History:  The patient  reports that she has never smoked. She does not have any smokeless tobacco history on file.   Family History:  The patient's family history includes Lung disease in her mother; Parkinson's disease in her mother; Stroke in her father.  ROS:  Please see the history of present illness.   Otherwise, review of systems are positive for none.   All other systems are reviewed and negative.    PHYSICAL EXAM: VS:  BP 100/74 mmHg  Pulse 88  Ht 5\' 6"  (1.676 m)  Wt 189 lb (85.73 kg)  BMI 30.52 kg/m2 , BMI Body mass index is 30.52 kg/(m^2). GEN: Well nourished, well developed, in no acute distress HEENT: normal Neck: no JVD, carotid bruits, or masses Cardiac: RRR; no murmurs, rubs, or gallops,no edema  Respiratory:  clear to auscultation bilaterally, normal work of breathing GI: soft, nontender, nondistended, + BS MS: no deformity or atrophy Skin: warm and dry, no rash Neuro:  Strength and sensation are intact Psych: euthymic mood, full affect   EKG:  EKG is not ordered today.    Recent Labs: No results found for requested labs within last 365 days.    Lipid Panel No results found for: CHOL, TRIG, HDL, CHOLHDL, VLDL, LDLCALC,  LDLDIRECT    Wt Readings from Last 3 Encounters:  05/13/15 189 lb (85.73 kg)  04/07/15 191 lb 3.2 oz (86.728 kg)  11/20/14 195 lb (88.451 kg)       ASSESSMENT AND PLAN:  1.  Mild OSA with an AHI of 10.4/hr with elevated Epworth Sleepiness score and oxygen desaturation.  Now on CPAP at 13cm H2O and tolerating well.  Patient has been using and benefiting from CPAP use and will continue to benefit from therapy. I have also instructed the patient on proper sleep hygiene, avoidance of sleeping in the supine position and avoidance of alcohol within 4 hours of bedtime.  The patient was also instructed to avoid driving if sleepy.   2.  Obesity - I encouraged her to get into a routine exercise program and watch her intake of carbs.  3.  Snoring - resolved on CPAP  Current medicines are reviewed at length with the patient today.  The patient does not have concerns regarding medicines.  The following changes have been made:  no change  Labs/ tests ordered today: See above Assessment and Plan No orders of the defined types were placed in this encounter.     Disposition:   FU with me in 6 months  Signed, Sueanne Margarita, MD  05/13/2015 4:23 PM    [facility] [address], [place], [place]  [address] Phone: [phone]; Fax: [phone]

## 2015-05-13 ENCOUNTER — Encounter: Payer: Self-pay | Admitting: Cardiology

## 2015-05-13 ENCOUNTER — Ambulatory Visit (INDEPENDENT_AMBULATORY_CARE_PROVIDER_SITE_OTHER): Payer: BLUE CROSS/BLUE SHIELD | Admitting: Cardiology

## 2015-05-13 VITALS — BP 100/74 | HR 88 | Ht 66.0 in | Wt 189.0 lb

## 2015-05-13 DIAGNOSIS — R0683 Snoring: Secondary | ICD-10-CM | POA: Diagnosis not present

## 2015-05-13 DIAGNOSIS — G4733 Obstructive sleep apnea (adult) (pediatric): Secondary | ICD-10-CM | POA: Diagnosis not present

## 2015-05-13 DIAGNOSIS — E669 Obesity, unspecified: Secondary | ICD-10-CM

## 2015-05-13 NOTE — Patient Instructions (Signed)

## 2015-05-19 ENCOUNTER — Encounter: Payer: Self-pay | Admitting: Cardiology

## 2016-10-07 ENCOUNTER — Other Ambulatory Visit: Payer: Self-pay | Admitting: Internal Medicine

## 2016-10-07 DIAGNOSIS — R1011 Right upper quadrant pain: Secondary | ICD-10-CM

## 2016-10-13 ENCOUNTER — Ambulatory Visit
Admission: RE | Admit: 2016-10-13 | Discharge: 2016-10-13 | Disposition: A | Discharge status: Home / Self Care | Payer: BLUE CROSS/BLUE SHIELD | Source: Ambulatory Visit | Attending: Internal Medicine | Admitting: Internal Medicine

## 2016-10-13 DIAGNOSIS — R1011 Right upper quadrant pain: Secondary | ICD-10-CM

## 2016-10-26 ENCOUNTER — Encounter: Payer: Self-pay | Admitting: *Deleted

## 2017-01-03 ENCOUNTER — Encounter: Payer: Self-pay | Admitting: Gastroenterology

## 2017-11-10 ENCOUNTER — Other Ambulatory Visit: Payer: Self-pay | Admitting: Obstetrics and Gynecology

## 2017-11-10 DIAGNOSIS — N644 Mastodynia: Secondary | ICD-10-CM

## 2017-11-16 ENCOUNTER — Ambulatory Visit
Admission: RE | Admit: 2017-11-16 | Discharge: 2017-11-16 | Disposition: A | Discharge status: Home / Self Care | Payer: BLUE CROSS/BLUE SHIELD | Source: Ambulatory Visit | Attending: Obstetrics and Gynecology | Admitting: Obstetrics and Gynecology

## 2017-11-16 DIAGNOSIS — N644 Mastodynia: Secondary | ICD-10-CM

## 2019-02-03 ENCOUNTER — Ambulatory Visit
Admission: EM | Admit: 2019-02-03 | Discharge: 2019-02-03 | Disposition: A | Discharge status: Home / Self Care | Payer: BC Managed Care – PPO

## 2019-02-03 ENCOUNTER — Encounter: Payer: Self-pay | Admitting: Emergency Medicine

## 2019-02-03 ENCOUNTER — Other Ambulatory Visit: Payer: Self-pay

## 2019-02-03 DIAGNOSIS — R042 Hemoptysis: Secondary | ICD-10-CM | POA: Diagnosis not present

## 2019-02-03 NOTE — ED Provider Notes (Signed)
[facility]    CSN: [id number] Arrival date & time: 02/03/19  1306      History   Chief Complaint Chief Complaint  Patient presents with  . Hemoptysis    HPI Jean Kelly is a 58 y.o. female with history of laryngeal pharyngeal reflux, GERD, chronic cough presenting for of hemoptysis this morning.  States she could feel something in her throat, coughed and had a small chunk of blood.  Patient has not had cough since.  No fever, arthralgias, myalgias, recent illness, exposure to TB.  Patient has been without unintentional weight loss, night sweats as well.  Patient does use Afrin every night for nasal congestion: Denies frequent nosebleeds.   Past Medical History:  Diagnosis Date  . Cancer (HCC)    papillary thyroid  . Chronic constipation   . Depression   . Hyperlipidemia   . IBS (irritable bowel syndrome)   . Obesity (BMI 30.0-34.9) 05/12/2015  . OSA (obstructive sleep apnea) 08/25/2014   Mild OSA with an AHI of 10/hr now on CPAP at 13cm H2O   . Social phobia     Patient Active Problem List   Diagnosis Date Noted  . Obesity (BMI 30.0-34.9) 05/12/2015  . Upper airway cough syndrome 04/07/2015  . Excessive daytime sleepiness 08/25/2014  . OSA (obstructive sleep apnea) 08/25/2014  . Chest pain at rest 06/14/2014  . Snoring 06/14/2014  . HEMORRHOIDS 09/07/2007  . HYPERLIPIDEMIA 06/05/2007  . DEPRESSION 06/05/2007  . GERD 06/05/2007  . CONSTIPATION 06/05/2007  . DYSPHAGIA UNSPECIFIED 06/05/2007  . DIVERTICULOSIS, COLON 11/28/2006  . HIATAL HERNIA 06/23/2004  . MELANOSIS COLI 03/22/2000    Past Surgical History:  Procedure Laterality Date  . CYSTOCELE REPAIR    . FOOT SURGERY    . RECTOCELE REPAIR    . TOTAL ABDOMINAL HYSTERECTOMY    . TOTAL THYROIDECTOMY      OB History   No obstetric history on file.      Home Medications    Prior to Admission medications   Medication Sig Start Date End Date Taking? Authorizing Provider  calcium  gluconate 500 MG tablet Take 1 tablet by mouth 3 (three) times daily.   Yes [provider]  levothyroxine (SYNTHROID) 137 MCG tablet Take 137 mcg by mouth daily before breakfast.   Yes [provider]  PARoxetine (PAXIL) 10 MG tablet Take 10 mg by mouth daily. Pt does not remember the actual dose   Yes [provider]  amphetamine-dextroamphetamine (ADDERALL) 20 MG tablet Take 20 mg by mouth 2 (two) times daily. 04/29/15   [provider]  Amphetamine-Dextroamphetamine (AMPHETAMINE SALT COMBO PO) Take 20 mg by mouth daily.     [provider]  atorvastatin (LIPITOR) 40 MG tablet Take 40 mg by mouth 3 (three) times a week.     [provider]  b complex vitamins tablet Take 1 tablet by mouth daily.    [provider]  benztropine (COGENTIN) 0.5 MG tablet Take 0.5 mg by mouth daily.    [provider]  Cholecalciferol (VITAMIN D3) 5000 units CAPS Take 2 capsules by mouth daily.    [provider]  DULoxetine (CYMBALTA) 30 MG capsule Take 90 mg by mouth daily.     [provider]  pantoprazole (PROTONIX) 40 MG tablet Take 40 mg by mouth 2 (two) times daily. Reported on 05/13/2015    [provider]  potassium gluconate 595 (99 K) MG TABS tablet Take 595 mg by mouth 3 (  three) times daily.     [provider]  Ubiquinol 100 MG CAPS Take 1 capsule by mouth daily.    [provider]  UNABLE TO FIND Med Name: Iodine/Potassium supplment daily    [provider]  UNABLE TO FIND Med Name: Calcium Citrate with Magnesium daily    [provider]    Family History Family History  Problem Relation Age of Onset  . Parkinson's disease Mother   . Lung disease Mother   . Stroke Father     Social History Social History   Tobacco Use  . Smoking status: Never Smoker  . Smokeless tobacco: Never Used  Substance Use Topics  . Alcohol use: Never    Frequency: Never  . Drug  use: Never     Allergies   Amoxicillin and Penicillins   Review of Systems Review of Systems  Constitutional: Negative for activity change, appetite change, chills, fatigue and fever.  HENT: Negative for ear pain, sinus pain, sore throat and voice change.   Eyes: Negative for pain, redness and visual disturbance.  Respiratory: Negative for cough, shortness of breath and wheezing.   Cardiovascular: Negative for chest pain, palpitations and leg swelling.  Gastrointestinal: Negative for abdominal distention, abdominal pain, blood in stool, diarrhea, nausea and vomiting.  Musculoskeletal: Negative for arthralgias and myalgias.  Skin: Negative for rash and wound.  Allergic/Immunologic: Negative for food allergies and immunocompromised state.  Neurological: Negative for dizziness, syncope, light-headedness and headaches.     Physical Exam Triage Vital Signs ED Triage Vitals  Enc Vitals Group     BP      Pulse      Resp      Temp      Temp src      SpO2      Weight      Height      Head Circumference      Peak Flow      Pain Score      Pain Loc      Pain Edu?      Excl. in [place]?    No data found.  Updated Vital Signs BP 102/78 (BP Location: Left Arm)   Pulse (!) 117   Temp 99 F (37.2 C) (Oral)   Resp 18   SpO2 95%   Visual Acuity Right Eye Distance:   Left Eye Distance:   Bilateral Distance:    Right Eye Near:   Left Eye Near:    Bilateral Near:     Physical Exam Constitutional:      General: She is not in acute distress.    Appearance: She is not ill-appearing.  HENT:     Head: Normocephalic and atraumatic.     Jaw: There is normal jaw occlusion. No tenderness or pain on movement.     Right Ear: Hearing, tympanic membrane, ear canal and external ear normal. No tenderness. No mastoid tenderness.     Left Ear: Hearing, tympanic membrane, ear canal and external ear normal. No tenderness. No mastoid tenderness.     Nose: No nasal deformity, septal deviation  or nasal tenderness.     Right Turbinates: Not swollen or pale.     Left Turbinates: Not swollen or pale.     Right Sinus: No maxillary sinus tenderness or frontal sinus tenderness.     Left Sinus: No maxillary sinus tenderness or frontal sinus tenderness.     Comments: Is not edematous, nasal mucosa erythematous with scant blood near anterior  nares bilaterally.  No active bleeding, foreign body.    Mouth/Throat:     Lips: Pink. No lesions.     Mouth: Mucous membranes are moist. No injury.     Pharynx: Oropharynx is clear. Uvula midline. No posterior oropharyngeal erythema or uvula swelling.     Comments: no tonsillar exudate or hypertrophy Eyes:     General: No scleral icterus.    Conjunctiva/sclera: Conjunctivae normal.     Pupils: Pupils are equal, round, and reactive to light.  Neck:     Musculoskeletal: Normal range of motion and neck supple. No muscular tenderness.  Cardiovascular:     Rate and Rhythm: Regular rhythm. Tachycardia present.     Heart sounds: No murmur. No gallop.      Comments: HR 101-109 at bedside Pulmonary:     Effort: Pulmonary effort is normal. No respiratory distress.     Breath sounds: No stridor. No wheezing, rhonchi or rales.     Comments: Air movement bilaterally without prolonged expiratory phase. Musculoskeletal:     Right lower leg: No edema.     Left lower leg: No edema.  Lymphadenopathy:     Cervical: No cervical adenopathy.  Skin:    General: Skin is warm.     Capillary Refill: Capillary refill takes less than 2 seconds.     Coloration: Skin is not jaundiced or pale.     Findings: No rash.  Neurological:     General: No focal deficit present.     Mental Status: She is alert and oriented to person, place, and time.      UC Treatments / Results  Labs (all labs ordered are listed, but only abnormal results are displayed) Labs Reviewed - No data to display  EKG   Radiology No results found.  Procedures Procedures (including  critical care time)  Medications Ordered in UC Medications - No data to display  Initial Impression / Assessment and Plan / UC Course  I have reviewed the triage vital signs and the nursing notes.  Pertinent labs & imaging results that were available during my care of the patient were reviewed by me and considered in my medical decision making (see chart for details).     Patient afebrile, nontoxic in office today.  Single episode of hemoptysis without exposure or signs/symptoms concerning for active versus latent TB: Radiography deferred at this time.  Likely related to nosebleed from overuse of Afrin overnight.  Could also consider laryngeal versus of esophageal irritation from untreated reflux.  Patient to resume her GERD medications, follow-up with PCP, monitor symptoms in the interim.  Return precautions discussed, patient verbalized understanding and is agreeable to plan. Final Clinical Impressions(s) / UC Diagnoses   Final diagnoses:  Hemoptysis     Discharge Instructions     Discontinue Afrin use. Continue monitoring symptoms and follow-up with PCP on Monday. May need to be followed up by ENT and/or GI for further evaluation. Recommend you work taking your reflux medications daily. Return for worsening cough, recurrence of blood in sputum, fever, chest pain, shortness of breath, night sweats.    ED Prescriptions    None     PDMP not reviewed this encounter.   Hall[place]Potvin, [place], [place] 02/03/19 1606

## 2019-02-03 NOTE — ED Notes (Signed)
Patient able to ambulate independently  

## 2019-02-03 NOTE — Discharge Instructions (Signed)
Discontinue Afrin use. Continue monitoring symptoms and follow-up with PCP on Monday. May need to be followed up by ENT and/or GI for further evaluation. Recommend you work taking your reflux medications daily. Return for worsening cough, recurrence of blood in sputum, fever, chest pain, shortness of breath, night sweats.

## 2019-02-03 NOTE — ED Triage Notes (Signed)
Pt presents to [facility] for assessment of LPR and chronic cough.  Patient states she woke up this morning and coughed and got a chunk of "tissue and blood".  Patient states she coughed again shortly after and noted blood again, but has not had any blood since.

## 2019-04-17 DIAGNOSIS — E890 Postprocedural hypothyroidism: Secondary | ICD-10-CM | POA: Diagnosis not present

## 2019-04-17 DIAGNOSIS — E782 Mixed hyperlipidemia: Secondary | ICD-10-CM | POA: Diagnosis not present

## 2019-04-17 DIAGNOSIS — K219 Gastro-esophageal reflux disease without esophagitis: Secondary | ICD-10-CM | POA: Diagnosis not present

## 2019-04-17 DIAGNOSIS — R7309 Other abnormal glucose: Secondary | ICD-10-CM | POA: Diagnosis not present

## 2019-05-21 DIAGNOSIS — Z1231 Encounter for screening mammogram for malignant neoplasm of breast: Secondary | ICD-10-CM | POA: Diagnosis not present

## 2019-05-21 DIAGNOSIS — Z01419 Encounter for gynecological examination (general) (routine) without abnormal findings: Secondary | ICD-10-CM | POA: Diagnosis not present

## 2019-05-21 DIAGNOSIS — Z6829 Body mass index (BMI) 29.0-29.9, adult: Secondary | ICD-10-CM | POA: Diagnosis not present

## 2019-05-23 ENCOUNTER — Other Ambulatory Visit: Payer: Self-pay | Admitting: Obstetrics and Gynecology

## 2019-05-23 DIAGNOSIS — R928 Other abnormal and inconclusive findings on diagnostic imaging of breast: Secondary | ICD-10-CM

## 2019-06-04 DIAGNOSIS — M25551 Pain in right hip: Secondary | ICD-10-CM | POA: Diagnosis not present

## 2019-06-04 DIAGNOSIS — M461 Sacroiliitis, not elsewhere classified: Secondary | ICD-10-CM | POA: Diagnosis not present

## 2019-06-04 DIAGNOSIS — M545 Low back pain: Secondary | ICD-10-CM | POA: Diagnosis not present

## 2019-06-04 DIAGNOSIS — G894 Chronic pain syndrome: Secondary | ICD-10-CM | POA: Diagnosis not present

## 2019-06-08 ENCOUNTER — Other Ambulatory Visit: Payer: BC Managed Care – PPO

## 2019-06-19 DIAGNOSIS — H04123 Dry eye syndrome of bilateral lacrimal glands: Secondary | ICD-10-CM | POA: Diagnosis not present

## 2019-06-21 DIAGNOSIS — F3342 Major depressive disorder, recurrent, in full remission: Secondary | ICD-10-CM | POA: Diagnosis not present

## 2019-06-21 DIAGNOSIS — F900 Attention-deficit hyperactivity disorder, predominantly inattentive type: Secondary | ICD-10-CM | POA: Diagnosis not present

## 2019-07-05 ENCOUNTER — Other Ambulatory Visit: Payer: Self-pay

## 2019-08-01 ENCOUNTER — Other Ambulatory Visit: Payer: Self-pay

## 2019-08-01 DIAGNOSIS — N6320 Unspecified lump in the left breast, unspecified quadrant: Secondary | ICD-10-CM

## 2019-08-07 ENCOUNTER — Other Ambulatory Visit: Payer: Self-pay | Admitting: Obstetrics and Gynecology

## 2019-08-07 ENCOUNTER — Ambulatory Visit
Admission: RE | Admit: 2019-08-07 | Discharge: 2019-08-07 | Disposition: A | Discharge status: Home / Self Care | Payer: No Typology Code available for payment source | Source: Ambulatory Visit | Attending: Obstetrics and Gynecology | Admitting: Obstetrics and Gynecology

## 2019-08-07 ENCOUNTER — Other Ambulatory Visit: Payer: Self-pay

## 2019-08-07 ENCOUNTER — Ambulatory Visit: Payer: Self-pay | Admitting: *Deleted

## 2019-08-07 VITALS — BP 110/72 | Temp 97.5°F | Wt 185.3 lb

## 2019-08-07 DIAGNOSIS — N6320 Unspecified lump in the left breast, unspecified quadrant: Secondary | ICD-10-CM

## 2019-08-07 DIAGNOSIS — Z1239 Encounter for other screening for malignant neoplasm of breast: Secondary | ICD-10-CM

## 2019-08-07 NOTE — Progress Notes (Signed)
[name] is a 59 y.o. female who presents to [facility] clinic today due to additional imaging of the left breast being recommended. Screening mammogram completed 05/21/2019.   Pap Smear: Pap not smear completed today. Last Pap smear was 05/21/2019 at [facility] and was normal per patient. Per patient has a history of an abnormal Pap smear 10-15 years ago that had a repeat Pap smear was normal per patient. Patient has a history of a hysterectomy in 2009 for fibroids and AUB. Patient doesn't need any further Pap smears due to her history of a hysterectomy for benign reasons. Last Pap smear result is not in Epic.   Physical exam: Breasts Breasts symmetrical. No skin abnormalities bilateral breasts. No nipple retraction bilateral breasts. No nipple discharge bilateral breasts. No lymphadenopathy. No lumps palpated bilateral breasts. No complaints of pain or tenderness on exam.      Pelvic/Bimanual Pap is not indicated today per BCCCP guidelines.   Smoking History: Patient has never smoked.   Patient Navigation: Patient education provided. Access to services provided for patient through [facility] program.   Colorectal Cancer Screening: Per patient had a colonoscopy completed in 2016. No complaints today.    Breast and Cervical Cancer Risk Assessment: Patient has a family history of a niece having breast cancer. Patient has no known genetic mutations or history of radiation treatment to the chest before age 57. Patient has no history of cervical dysplasia, immunocompromised, or DES exposure in-utero.  Risk Assessment    Risk Scores      08/07/2019   Last edited by: Loletta Parish, RN   5-year risk: 1.4 %   Lifetime risk: 7.2 %          A: BCCCP exam without pap smear  P: Referred patient to the [facility] for a left breast diagnostic mammogram per recommendation. Appointment scheduled Tuesday, August 07, 2019 at 1400.  Loletta Parish,  RN 08/07/2019 1:14 PM

## 2019-08-07 NOTE — Patient Instructions (Addendum)
Explained breast self awareness with Rudy Jew Soto. Patient did not need a Pap smear today due to last Pap smear was 05/21/2019 per patient. Let her know she doesn't need any further Pap smears due to her history of a hysterectomy for benign reasons. Referred patient to the [facility] for a left breast diagnostic mammogram per recommendation. Appointment scheduled Tuesday, August 07, 2019 at 1400. Patient aware of appointment and will be there. Rudy Jew Makela verbalized understanding.  Kierston Plasencia, Arvil Chaco, RN 1:13 PM

## 2019-11-26 ENCOUNTER — Other Ambulatory Visit: Payer: Self-pay | Admitting: Obstetrics and Gynecology

## 2019-11-26 ENCOUNTER — Ambulatory Visit
Admission: RE | Admit: 2019-11-26 | Discharge: 2019-11-26 | Disposition: A | Discharge status: Home / Self Care | Payer: No Typology Code available for payment source | Source: Ambulatory Visit | Attending: Obstetrics and Gynecology | Admitting: Obstetrics and Gynecology

## 2019-11-26 ENCOUNTER — Other Ambulatory Visit: Payer: Self-pay

## 2019-11-26 DIAGNOSIS — R921 Mammographic calcification found on diagnostic imaging of breast: Secondary | ICD-10-CM

## 2019-11-26 DIAGNOSIS — N6320 Unspecified lump in the left breast, unspecified quadrant: Secondary | ICD-10-CM

## 2020-04-29 DIAGNOSIS — F332 Major depressive disorder, recurrent severe without psychotic features: Secondary | ICD-10-CM | POA: Diagnosis not present

## 2020-05-01 DIAGNOSIS — F332 Major depressive disorder, recurrent severe without psychotic features: Secondary | ICD-10-CM | POA: Diagnosis not present

## 2020-05-02 DIAGNOSIS — F332 Major depressive disorder, recurrent severe without psychotic features: Secondary | ICD-10-CM | POA: Diagnosis not present

## 2020-05-03 DIAGNOSIS — F332 Major depressive disorder, recurrent severe without psychotic features: Secondary | ICD-10-CM | POA: Diagnosis not present

## 2020-05-06 DIAGNOSIS — F332 Major depressive disorder, recurrent severe without psychotic features: Secondary | ICD-10-CM | POA: Diagnosis not present

## 2020-05-08 DIAGNOSIS — F332 Major depressive disorder, recurrent severe without psychotic features: Secondary | ICD-10-CM | POA: Diagnosis not present

## 2020-05-09 DIAGNOSIS — F332 Major depressive disorder, recurrent severe without psychotic features: Secondary | ICD-10-CM | POA: Diagnosis not present

## 2020-05-13 DIAGNOSIS — F332 Major depressive disorder, recurrent severe without psychotic features: Secondary | ICD-10-CM | POA: Diagnosis not present

## 2020-05-22 ENCOUNTER — Ambulatory Visit
Admission: RE | Admit: 2020-05-22 | Discharge: 2020-05-22 | Disposition: A | Discharge status: Home / Self Care | Payer: No Typology Code available for payment source | Source: Ambulatory Visit | Attending: Obstetrics and Gynecology | Admitting: Obstetrics and Gynecology

## 2020-05-22 ENCOUNTER — Other Ambulatory Visit: Payer: Self-pay

## 2020-05-22 DIAGNOSIS — R921 Mammographic calcification found on diagnostic imaging of breast: Secondary | ICD-10-CM

## 2020-08-25 DIAGNOSIS — F900 Attention-deficit hyperactivity disorder, predominantly inattentive type: Secondary | ICD-10-CM | POA: Diagnosis not present

## 2020-08-25 DIAGNOSIS — F410 Panic disorder [episodic paroxysmal anxiety] without agoraphobia: Secondary | ICD-10-CM | POA: Diagnosis not present

## 2020-08-25 DIAGNOSIS — F3342 Major depressive disorder, recurrent, in full remission: Secondary | ICD-10-CM | POA: Diagnosis not present

## 2020-09-08 DIAGNOSIS — Z01419 Encounter for gynecological examination (general) (routine) without abnormal findings: Secondary | ICD-10-CM | POA: Diagnosis not present

## 2020-09-08 DIAGNOSIS — Z6827 Body mass index (BMI) 27.0-27.9, adult: Secondary | ICD-10-CM | POA: Diagnosis not present

## 2020-09-30 DIAGNOSIS — R000 Tachycardia, unspecified: Secondary | ICD-10-CM | POA: Diagnosis not present

## 2020-09-30 DIAGNOSIS — J209 Acute bronchitis, unspecified: Secondary | ICD-10-CM | POA: Diagnosis not present

## 2020-09-30 DIAGNOSIS — J189 Pneumonia, unspecified organism: Secondary | ICD-10-CM | POA: Diagnosis not present

## 2020-10-18 DIAGNOSIS — F410 Panic disorder [episodic paroxysmal anxiety] without agoraphobia: Secondary | ICD-10-CM | POA: Diagnosis not present

## 2020-10-18 DIAGNOSIS — F900 Attention-deficit hyperactivity disorder, predominantly inattentive type: Secondary | ICD-10-CM | POA: Diagnosis not present

## 2020-10-18 DIAGNOSIS — F3342 Major depressive disorder, recurrent, in full remission: Secondary | ICD-10-CM | POA: Diagnosis not present

## 2020-11-06 DIAGNOSIS — Z85850 Personal history of malignant neoplasm of thyroid: Secondary | ICD-10-CM | POA: Diagnosis not present

## 2020-11-06 DIAGNOSIS — E890 Postprocedural hypothyroidism: Secondary | ICD-10-CM | POA: Diagnosis not present

## 2020-11-06 DIAGNOSIS — R7301 Impaired fasting glucose: Secondary | ICD-10-CM | POA: Diagnosis not present

## 2020-11-07 DIAGNOSIS — E890 Postprocedural hypothyroidism: Secondary | ICD-10-CM | POA: Diagnosis not present

## 2020-11-07 DIAGNOSIS — Z85850 Personal history of malignant neoplasm of thyroid: Secondary | ICD-10-CM | POA: Diagnosis not present

## 2020-11-07 DIAGNOSIS — R7301 Impaired fasting glucose: Secondary | ICD-10-CM | POA: Diagnosis not present

## 2021-01-13 DIAGNOSIS — E890 Postprocedural hypothyroidism: Secondary | ICD-10-CM | POA: Diagnosis not present

## 2021-03-25 ENCOUNTER — Other Ambulatory Visit: Payer: Self-pay

## 2021-03-25 DIAGNOSIS — R921 Mammographic calcification found on diagnostic imaging of breast: Secondary | ICD-10-CM

## 2021-04-06 DIAGNOSIS — R000 Tachycardia, unspecified: Secondary | ICD-10-CM | POA: Diagnosis not present

## 2021-04-06 DIAGNOSIS — J189 Pneumonia, unspecified organism: Secondary | ICD-10-CM | POA: Diagnosis not present

## 2021-04-06 DIAGNOSIS — R030 Elevated blood-pressure reading, without diagnosis of hypertension: Secondary | ICD-10-CM | POA: Diagnosis not present

## 2021-04-06 DIAGNOSIS — R059 Cough, unspecified: Secondary | ICD-10-CM | POA: Diagnosis not present

## 2021-04-13 DIAGNOSIS — F900 Attention-deficit hyperactivity disorder, predominantly inattentive type: Secondary | ICD-10-CM | POA: Diagnosis not present

## 2021-04-13 DIAGNOSIS — F331 Major depressive disorder, recurrent, moderate: Secondary | ICD-10-CM | POA: Diagnosis not present

## 2021-05-12 ENCOUNTER — Ambulatory Visit: Payer: Self-pay

## 2021-05-12 DIAGNOSIS — Z1211 Encounter for screening for malignant neoplasm of colon: Secondary | ICD-10-CM

## 2021-05-19 ENCOUNTER — Other Ambulatory Visit: Payer: Self-pay

## 2021-05-19 ENCOUNTER — Ambulatory Visit: Payer: Self-pay | Admitting: *Deleted

## 2021-05-19 VITALS — BP 120/74 | Wt 167.3 lb

## 2021-05-19 DIAGNOSIS — Z1239 Encounter for other screening for malignant neoplasm of breast: Secondary | ICD-10-CM

## 2021-05-19 NOTE — Patient Instructions (Signed)
Explained breast self awareness with Jean Hughs Ramirez. Patient did not need a Pap smear today due to last Pap smear was 05/21/2019 per patient. Let her know she doesn't need any further Pap smears due to her history of a hysterectomy for benign reasons. Referred patient to the [facility] for a diagnostic mammogram per recommendation. Appointment scheduled Tuesday, May 26, 2021 at 1240. Patient aware of appointment and will be there. Jean Kelly verbalized understanding. ? ?Keven Osborn, Arvil Chaco, RN ?1:24 PM ? ? ? ? ?

## 2021-05-19 NOTE — Progress Notes (Signed)
[name] is a 61 y.o. female who presents to [facility] clinic today with no complaints. Patient referred to [facility] clinic today due to patient had a screening mammogram completed 05/21/2019 that additional imaging of the left breast was recommended for follow up. Left breast diagnostic mammogram and ultrasound was completed for follow up 08/07/2019 that was probably benign with a left diagnostic mammogram recommended in 3 months. Left breast diagnostic mammogram was completed for follow up 11/26/2019 that was probably beign with a bilateral diagnostic mammogram recommended in 6 months that was completed 05/22/2020 that a bilateral diagnostic mammogram in one year was recommended for follow up. Patient presents to [facility] today for her one year follow up. ?  ?Pap Smear: Pap not smear completed today. Last Pap smear was 05/21/2019 at [facility] and was normal per patient. Per patient has a history of an abnormal Pap smear 12-16 years ago that had a repeat Pap smear was normal per patient. Patient has a history of a hysterectomy in 2009 for fibroids and AUB. Patient doesn't need any further Pap smears due to her history of a hysterectomy for benign reasons. Last Pap smear result is not available in Epic. ? ?Physical exam: ?Breasts ?Breasts symmetrical. No skin abnormalities bilateral breasts. No nipple retraction bilateral breasts. No nipple discharge bilateral breasts. No lymphadenopathy. No lumps palpated bilateral breasts. No complaints of pain or tenderness on exam.    ? ?MM DIAG BREAST TOMO UNI LEFT ? ?Result Date: 11/26/2019 ?CLINICAL DATA:  61 year old female presenting for short-term follow-up of probably benign left breast calcifications. EXAM: DIGITAL DIAGNOSTIC UNILATERAL LEFT MAMMOGRAM WITH TOMO AND CAD COMPARISON:  Previous exam(s). ACR Breast Density Category b: There are scattered areas of fibroglandular density. FINDINGS: Full field tomosynthesis and spot 2D magnification views of  the left breast were performed. There is a stable 4 mm group of faint punctate calcifications in the superior central left breast. These are associated with a small isodense mass. No significant change compared to the prior mammogram. No suspicious branching or linear forms. No architectural distortion. IMPRESSION: Stable probably benign calcifications in the superior central left breast. RECOMMENDATION: Diagnostic bilateral mammogram in 6 months. I have discussed the findings and recommendations with the patient. If applicable, a reminder letter will be sent to the patient regarding the next appointment. BI-RADS CATEGORY  3: Probably benign. Electronically Signed   By: Audie Pinto M.D.   On: 11/26/2019 13:58  ? ?MM DIAG BREAST TOMO BILATERAL ? ?Result Date: 05/22/2020 ?CLINICAL DATA:  Follow-up probably benign left breast calcifications. EXAM: DIGITAL DIAGNOSTIC BILATERAL MAMMOGRAM WITH TOMOSYNTHESIS AND CAD TECHNIQUE: Bilateral digital diagnostic mammography and breast tomosynthesis was performed. The images were evaluated with computer-aided detection. COMPARISON:  Previous exam(s). ACR Breast Density Category b: There are scattered areas of fibroglandular density. FINDINGS: The previously described 4 mm group of faint, punctate calcifications in the upper central left breast associated with a small, isodense mass not changed significantly since 11/26/2019, 08/07/2019 and the screening mammogram dated 05/21/2019. No interval findings suspicious for malignancy in either breast. IMPRESSION: 1. Stable left breast probably benign calcifications. 2. No evidence of malignancy elsewhere in either breast. RECOMMENDATION: Bilateral diagnostic mammogram in 1 year to re-evaluate the left breast probably benign calcifications seen initially 1 year ago. I have discussed the findings and recommendations with the patient. If applicable, a reminder letter will be sent to the patient regarding the next appointment. BI-RADS  CATEGORY  3: Probably benign. Electronically Signed   By: Remo Lipps  Joneen Caraway M.D.   On: 05/22/2020 16:22  ? ?MM DIAG BREAST TOMO BILATERAL ? ?Result Date: 11/16/2017 ?CLINICAL DATA:  Patient presents for bilateral diagnostic examination due to a palpable abnormality over the upper outer right breast for a couple months. Somewhat tender in this area. Patient is due for her annual bilateral mammogram. EXAM: DIGITAL DIAGNOSTIC bilateral MAMMOGRAM WITH CAD AND TOMO ULTRASOUND right BREAST COMPARISON:  Previous exam(s). ACR Breast Density Category b: There are scattered areas of fibroglandular density. FINDINGS: Examination demonstrates no focal abnormality over the outer midportion of the right breast to account for patient's palpable abnormality. There are a few scattered benign bilateral microcalcifications. Mammographic images were processed with CAD. On physical exam, I palpate no focal abnormality over the outer midportion of the right breast. Targeted ultrasound is performed, showing no focal abnormality over the outer midportion of the right breast to account for patient's palpable abnormality. IMPRESSION: No focal abnormality over the outer midportion of the right breast to account for patient's palpable abnormality. RECOMMENDATION: Recommend continued management of patient's right breast palpable abnormality on a clinical basis. Otherwise, recommend continued annual bilateral screening mammographic follow-up. I have discussed the findings and recommendations with the patient. Results were also provided in writing at the conclusion of the visit. If applicable, a reminder letter will be sent to the patient regarding the next appointment. BI-RADS CATEGORY  2: Benign. Electronically Signed   By: Marin Olp M.D.   On: 11/16/2017 10:21  ? ?MS DIGITAL DIAG TOMO UNI LEFT ? ?Result Date: 08/07/2019 ?CLINICAL DATA:  Recall from screening mammography with tomosynthesis, possible mass and separate adjacent calcifications in the  UPPER LEFT breast EXAM: DIGITAL DIAGNOSTIC LEFT MAMMOGRAM WITH CAD AND TOMO ULTRASOUND LEFT BREAST COMPARISON:  Previous exam(s). ACR Breast Density Category b: There are scattered areas of fibroglandular density. FINDINGS: Tomosynthesis and synthesized spot-compression CC and MLO views of the area of concern in the LEFT breast were obtained. Standard spot magnification CC and mediolateral views of the LEFT breast calcifications and a standard 2D full field mediolateral view of the LEFT breast were also obtained. Spot compression and magnified images confirm a circumscribed isodense mass in the UPPER breast at ANTERIOR to MIDDLE depth measuring approximately 4 mm, associated with faint punctate calcifications, without associated architectural distortion. These images also confirm an adjacent group of faint punctate calcifications which are associated with an isodense mass, again without associated architectural distortion. No suspicious findings elsewhere on the full field mediolateral image. The full field mediolateral image was processed with CAD. Targeted LEFT breast ultrasound is performed, showing a nearly round anechoic mass with scattered internal echoes at the 11:30 o'clock position approximately 2 cm from the nipple, with punctate calcifications in its wall, measuring approximately 3 x 2 x 3 mm, demonstrating posterior acoustic enhancement and no internal power Doppler flow, corresponding to the mammographic mass. Immediately adjacent to this at the 12 o'clock position approximately 2 cm from the nipple are clustered cysts measuring approximately 4 x 2 x 2 mm, demonstrating no posterior characteristics and no internal power Doppler flow, likely corresponding to the mass associated with the mammographic calcifications. No suspicious solid mass or abnormal acoustic shadowing is identified. IMPRESSION: 1. Likely benign 4 mm group of faint punctate calcifications in the UPPER LEFT breast at ANTERIOR depth which  may be associated with benign apocrine metaplasia or focal fibrocystic changes. 2. Benign cyst in the UPPER LEFT breast, adjacent to the calcifications, accounting for the screening mammographic finding

## 2021-05-22 ENCOUNTER — Other Ambulatory Visit: Payer: Self-pay | Admitting: Gastroenterology

## 2021-05-22 DIAGNOSIS — K760 Fatty (change of) liver, not elsewhere classified: Secondary | ICD-10-CM | POA: Diagnosis not present

## 2021-05-26 ENCOUNTER — Ambulatory Visit
Admission: RE | Admit: 2021-05-26 | Discharge: 2021-05-26 | Disposition: A | Discharge status: Home / Self Care | Payer: No Typology Code available for payment source | Source: Ambulatory Visit | Attending: Obstetrics and Gynecology | Admitting: Obstetrics and Gynecology

## 2021-05-26 ENCOUNTER — Ambulatory Visit: Payer: Self-pay

## 2021-05-27 ENCOUNTER — Other Ambulatory Visit: Payer: Self-pay

## 2021-05-27 ENCOUNTER — Ambulatory Visit
Admission: RE | Admit: 2021-05-27 | Discharge: 2021-05-27 | Disposition: A | Discharge status: Home / Self Care | Payer: BC Managed Care – PPO | Source: Ambulatory Visit | Attending: Gastroenterology | Admitting: Gastroenterology

## 2021-05-27 DIAGNOSIS — K760 Fatty (change of) liver, not elsewhere classified: Secondary | ICD-10-CM

## 2021-06-03 DIAGNOSIS — K5730 Diverticulosis of large intestine without perforation or abscess without bleeding: Secondary | ICD-10-CM | POA: Diagnosis not present

## 2021-06-03 DIAGNOSIS — K649 Unspecified hemorrhoids: Secondary | ICD-10-CM | POA: Diagnosis not present

## 2021-06-03 DIAGNOSIS — Z86010 Personal history of colonic polyps: Secondary | ICD-10-CM | POA: Diagnosis not present

## 2021-06-03 DIAGNOSIS — D123 Benign neoplasm of transverse colon: Secondary | ICD-10-CM | POA: Diagnosis not present

## 2021-06-03 DIAGNOSIS — Z8371 Family history of colonic polyps: Secondary | ICD-10-CM | POA: Diagnosis not present

## 2021-09-15 DIAGNOSIS — F3341 Major depressive disorder, recurrent, in partial remission: Secondary | ICD-10-CM | POA: Diagnosis not present

## 2021-09-15 DIAGNOSIS — F900 Attention-deficit hyperactivity disorder, predominantly inattentive type: Secondary | ICD-10-CM | POA: Diagnosis not present

## 2022-03-12 DIAGNOSIS — F3342 Major depressive disorder, recurrent, in full remission: Secondary | ICD-10-CM | POA: Diagnosis not present

## 2022-03-12 DIAGNOSIS — F900 Attention-deficit hyperactivity disorder, predominantly inattentive type: Secondary | ICD-10-CM | POA: Diagnosis not present

## 2022-03-14 ENCOUNTER — Ambulatory Visit (INDEPENDENT_AMBULATORY_CARE_PROVIDER_SITE_OTHER): Payer: BC Managed Care – PPO

## 2022-03-14 ENCOUNTER — Ambulatory Visit
Admission: EM | Admit: 2022-03-14 | Discharge: 2022-03-14 | Disposition: A | Discharge status: Home / Self Care | Payer: BC Managed Care – PPO | Attending: Internal Medicine | Admitting: Internal Medicine

## 2022-03-14 DIAGNOSIS — R000 Tachycardia, unspecified: Secondary | ICD-10-CM | POA: Diagnosis not present

## 2022-03-14 DIAGNOSIS — R053 Chronic cough: Secondary | ICD-10-CM

## 2022-03-14 DIAGNOSIS — R079 Chest pain, unspecified: Secondary | ICD-10-CM

## 2022-03-14 DIAGNOSIS — J189 Pneumonia, unspecified organism: Secondary | ICD-10-CM

## 2022-03-14 DIAGNOSIS — R0789 Other chest pain: Secondary | ICD-10-CM

## 2022-03-14 DIAGNOSIS — R059 Cough, unspecified: Secondary | ICD-10-CM

## 2022-03-14 MED ORDER — AZITHROMYCIN 250 MG PO TABS: ORAL_TABLET | ORAL | 0 refills | Status: AC

## 2022-03-14 MED ORDER — BENZONATATE 100 MG PO CAPS: 100.0000 mg | ORAL_CAPSULE | Freq: Three times a day (TID) | ORAL | 0 refills | Status: AC | PRN

## 2022-03-14 NOTE — ED Triage Notes (Addendum)
[facility] and uri since christmas. Pt reports last night she had indigestion and nothing helped and symptoms are the same. Pt reports pain is worse when breathing in deep pain is 3/10

## 2022-03-14 NOTE — Discharge Instructions (Signed)
Your x-ray is showing something that we call atypical pneumonia.  I have prescribed an antibiotic to help treat this as well as a cough medication.  Recommend that you get a repeat chest x-ray in 4 to 6 weeks to make sure infection is cleared.  Please follow-up if any symptoms persist or worsen.

## 2022-03-14 NOTE — ED Provider Notes (Signed)
[facility]    CSN: [id number] Arrival date & time: 03/14/22  0949      History   Chief Complaint Chief Complaint  Patient presents with   URI   Nasal Congestion   Cough   Chest Pain    HPI Jean Kelly is a 62 y.o. female.   Patient presents with cough that started around Christmas time and has been present for multiple weeks.  Reports symptoms started with sore throat and headache and progressed to nasal congestion and runny nose.  Patient reports that those symptoms have resolved and she is left with a productive cough.  Denies any associated fever.  Patient reports that she developed a feeling of "indigestion" in her chest last night.  She states that she does have history of indigestion and takes multiple medications for this.  She took her medication last night with no improvement so this concerned her.  Reports the pain is 4/10 on pain scale and is in the center of the chest.  Reports pain worsens with taking deep breaths.  Denies shortness of breath.  Denies history of asthma or COPD and patient does not smoke cigarettes.   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   Chronic constipation    Depression    Hyperlipidemia    IBS (irritable bowel syndrome)    Obesity (BMI 30.0-34.9) 05/12/2015   OSA (obstructive sleep apnea) 08/25/2014   Mild OSA with an AHI of 10/hr now on CPAP at 13cm H2O    Social phobia     Patient Active Problem List   Diagnosis Date Noted   Obesity (BMI 30.0-34.9) 05/12/2015   Upper airway cough syndrome 04/07/2015   Excessive daytime sleepiness 08/25/2014   OSA (obstructive sleep apnea) 08/25/2014   Chest pain at rest 06/14/2014   Snoring 06/14/2014   HEMORRHOIDS 09/07/2007   HYPERLIPIDEMIA 06/05/2007   DEPRESSION 06/05/2007   GERD 06/05/2007   CONSTIPATION 06/05/2007   DYSPHAGIA UNSPECIFIED 06/05/2007   DIVERTICULOSIS, COLON 11/28/2006   HIATAL HERNIA  06/23/2004   MELANOSIS COLI 03/22/2000    Past Surgical History:  Procedure Laterality Date   CYSTOCELE REPAIR     FOOT SURGERY     RECTOCELE REPAIR     TOTAL ABDOMINAL HYSTERECTOMY     TOTAL THYROIDECTOMY      OB History   No obstetric history on file.      Home Medications    Prior to Admission medications   Medication Sig Start Date End Date Taking? Authorizing Provider  azithromycin (ZITHROMAX Z-PAK) 250 MG tablet Take 2 tablets (500 mg total) by mouth daily for 1 day, THEN 1 tablet (250 mg total) daily for 4 days. 03/14/22 03/19/22 Yes Janyce Ellinger, Michele Rockers, FNP  benzonatate (TESSALON) 100 MG capsule Take 1 capsule (100 mg total) by mouth every 8 (eight) hours as needed for cough. 03/14/22  Yes Kandis Henry, Hildred Alamin E, FNP  amphetamine-dextroamphetamine (ADDERALL) 20 MG tablet Take 20 mg by mouth 2 (two) times daily. 04/29/15   [provider]  Amphetamine-Dextroamphetamine (AMPHETAMINE SALT COMBO PO) Take 20 mg by mouth daily.     [provider]  atorvastatin (LIPITOR) 40 MG tablet Take 40 mg by mouth 3 (three) times a week.     [provider]  b complex vitamins tablet Take 1 tablet by mouth daily.    [provider]  benztropine (COGENTIN) 0.5 MG tablet Take 0.5 mg  by mouth daily.    [provider]  calcium gluconate 500 MG tablet Take 1 tablet by mouth 3 (three) times daily.    [provider]  Cholecalciferol (VITAMIN D3) 5000 units CAPS Take 2 capsules by mouth daily.    [provider]  DULoxetine (CYMBALTA) 30 MG capsule Take 90 mg by mouth daily.     [provider]  FLUoxetine (PROZAC) 40 MG capsule Take 40 mg by mouth daily. 05/07/21   [provider]  levothyroxine (SYNTHROID) 137 MCG tablet Take 137 mcg by mouth daily before breakfast.    [provider]  pantoprazole (PROTONIX) 40 MG tablet Take 40 mg by mouth 2 (two) times daily. Reported on 05/13/2015    [provider]  PARoxetine  (PAXIL) 10 MG tablet Take 10 mg by mouth daily. Pt does not remember the actual dose    [provider]  potassium gluconate 595 (99 K) MG TABS tablet Take 595 mg by mouth 3 (three) times daily.     [provider]  Ubiquinol 100 MG CAPS Take 1 capsule by mouth daily.    [provider]  UNABLE TO FIND Med Name: Iodine/Potassium supplment daily    [provider]  UNABLE TO FIND Med Name: Calcium Citrate with Magnesium daily    [provider]  Vilazodone HCl (VIIBRYD) 10 MG TABS Take 10 mg by mouth daily. 05/14/21   [provider]  Zinc 50 MG TABS 1 tablet    [provider]    Family History Family History  Problem Relation Age of Onset   Parkinson's disease Mother    Lung disease Mother    Stroke Father    Diabetes Brother     Social History Social History   Tobacco Use   Smoking status: Never   Smokeless tobacco: Never  Vaping Use   Vaping Use: Never used  Substance Use Topics   Alcohol use: Never   Drug use: Never     Allergies   Amoxicillin and Penicillins   Review of Systems Review of Systems Per HPI  Physical Exam Triage Vital Signs ED Triage Vitals [03/14/22 1008]  Enc Vitals Group     BP 132/84     Pulse Rate (!) 109     Resp 20     Temp 98.5 F (36.9 C)     Temp Source Oral     SpO2 98 %     Weight      Height      Head Circumference      Peak Flow      Pain Score 4     Pain Loc      Pain Edu?      Excl. in [place]?    No data found.  Updated Vital Signs BP 132/84   Pulse (!) 109   Temp 98.5 F (36.9 C) (Oral)   Resp 20   SpO2 98%   Visual Acuity Right Eye Distance:   Left Eye Distance:   Bilateral Distance:    Right Eye Near:   Left Eye Near:    Bilateral Near:     Physical Exam Constitutional:      General: She is not in acute distress.    Appearance: Normal appearance. She is not toxic-appearing or diaphoretic.  HENT:     Head: Normocephalic and atraumatic.      Right Ear: Tympanic membrane and ear canal normal.     Left Ear: Tympanic membrane  and ear canal normal.     Nose: No congestion.     Mouth/Throat:     Mouth: Mucous membranes are moist.     Pharynx: No posterior oropharyngeal erythema.  Eyes:     Extraocular Movements: Extraocular movements intact.     Conjunctiva/sclera: Conjunctivae normal.     Pupils: Pupils are equal, round, and reactive to light.  Cardiovascular:     Rate and Rhythm: Regular rhythm. Tachycardia present.     Pulses: Normal pulses.     Heart sounds: Normal heart sounds.  Pulmonary:     Effort: Pulmonary effort is normal. No respiratory distress.     Breath sounds: Normal breath sounds. No stridor. No wheezing, rhonchi or rales.  Abdominal:     General: Abdomen is flat. Bowel sounds are normal.     Palpations: Abdomen is soft.  Musculoskeletal:        General: Normal range of motion.     Cervical back: Normal range of motion.  Skin:    General: Skin is warm and dry.  Neurological:     General: No focal deficit present.     Mental Status: She is alert and oriented to person, place, and time. Mental status is at baseline.  Psychiatric:        Mood and Affect: Mood normal.        Behavior: Behavior normal.      UC Treatments / Results  Labs (all labs ordered are listed, but only abnormal results are displayed) Labs Reviewed - No data to display  EKG   Radiology DG Chest 2 View  Result Date: 03/14/2022 CLINICAL DATA:  Cough and chest pain. EXAM: CHEST - 2 VIEW COMPARISON:  Chest x-ray dated 04/17/2015 FINDINGS: Prominent interstitial markings bilaterally. No confluent opacity to suggest a consolidating pneumonia. No pleural effusion or pneumothorax is seen. Heart size and mediastinal contours are stable. Osseous structures about the chest are unremarkable. IMPRESSION: 1. Prominent interstitial markings throughout both lungs, a nonspecific finding that can indicate atypical/viral pneumonia. 2. No evidence  of a consolidating pneumonia. Electronically Signed   By: Franki Cabot M.D.   On: 03/14/2022 11:05    Procedures Procedures (including critical care time)  Medications Ordered in UC Medications - No data to display  Initial Impression / Assessment and Plan / UC Course  I have reviewed the triage vital signs and the nursing notes.  Pertinent labs & imaging results that were available during my care of the patient were reviewed by me and considered in my medical decision making (see chart for details).     EKG completed given patient reporting indigestion-like feeling in the chest as well as patient having tachycardia.  It showed mild tachycardia but no other acute abnormalities.  Patient reporting that her tachycardia is due to her Adderall as this is baseline for her.  Advised patient it will be best to go to the ER for further evaluation and management given concern for cardiac etiology.  She declined going to the hospital.  Risks associated with not going to the hospital were discussed with patient.  Patient voiced understanding and accepted risks.  Given the patient is not going to go to the hospital, will do limited workup at\ Urgent care.  Chest x-ray showing concerns for atypical/viral pneumonia.  Will treat with azithromycin to cover for atypical pneumonia.  Patient does take fluoxetine which can cause QT prolongation but EKG QT interval is normal so this should be safe.  Benzonatate prescribed to take  as needed for cough.  Suspect findings on x-ray are causing chest discomfort.  Patient advised to return in 4 to 6 weeks to have repeat chest x-ray to ensure infection is clear.  Patient was given strict return and ER precautions and advised go the ER if chest comfort continues or worsens.  Patient verbalized understanding and was agreeable with plan. Final Clinical Impressions(s) / UC Diagnoses   Final diagnoses:  Atypical pneumonia  Persistent cough  Tachycardia  Chest discomfort      Discharge Instructions      Your x-ray is showing something that we call atypical pneumonia.  I have prescribed an antibiotic to help treat this as well as a cough medication.  Recommend that you get a repeat chest x-ray in 4 to 6 weeks to make sure infection is cleared.  Please follow-up if any symptoms persist or worsen.     ED Prescriptions     Medication Sig Dispense Auth. Provider   azithromycin (ZITHROMAX Z-PAK) 250 MG tablet Take 2 tablets (500 mg total) by mouth daily for 1 day, THEN 1 tablet (250 mg total) daily for 4 days. 6 tablet [place], [place] E, [place]   benzonatate (TESSALON) 100 MG capsule Take 1 capsule (100 mg total) by mouth every 8 (eight) hours as needed for cough. 21 capsule [place], Michele Rockers, [place]      PDMP not reviewed this encounter.   Teodora Medici, [place] 03/14/22 1139

## 2022-05-28 DIAGNOSIS — Z130 Encounter for screening for diseases of the blood and blood-forming organs and certain disorders involving the immune mechanism: Secondary | ICD-10-CM | POA: Diagnosis not present

## 2022-06-03 DIAGNOSIS — E890 Postprocedural hypothyroidism: Secondary | ICD-10-CM | POA: Diagnosis not present

## 2022-06-04 DIAGNOSIS — R413 Other amnesia: Secondary | ICD-10-CM | POA: Diagnosis not present

## 2022-06-04 DIAGNOSIS — E890 Postprocedural hypothyroidism: Secondary | ICD-10-CM | POA: Diagnosis not present

## 2022-06-04 DIAGNOSIS — Z85850 Personal history of malignant neoplasm of thyroid: Secondary | ICD-10-CM | POA: Diagnosis not present

## 2022-06-04 DIAGNOSIS — R5383 Other fatigue: Secondary | ICD-10-CM | POA: Diagnosis not present

## 2022-06-29 DIAGNOSIS — N390 Urinary tract infection, site not specified: Secondary | ICD-10-CM | POA: Diagnosis not present

## 2022-07-02 DIAGNOSIS — Z6827 Body mass index (BMI) 27.0-27.9, adult: Secondary | ICD-10-CM | POA: Diagnosis not present

## 2022-07-02 DIAGNOSIS — Z01419 Encounter for gynecological examination (general) (routine) without abnormal findings: Secondary | ICD-10-CM | POA: Diagnosis not present

## 2022-07-02 DIAGNOSIS — N390 Urinary tract infection, site not specified: Secondary | ICD-10-CM | POA: Diagnosis not present

## 2022-07-02 DIAGNOSIS — R319 Hematuria, unspecified: Secondary | ICD-10-CM | POA: Diagnosis not present

## 2022-07-20 DIAGNOSIS — N390 Urinary tract infection, site not specified: Secondary | ICD-10-CM | POA: Diagnosis not present

## 2022-07-20 DIAGNOSIS — E890 Postprocedural hypothyroidism: Secondary | ICD-10-CM | POA: Diagnosis not present

## 2022-07-20 DIAGNOSIS — R8290 Unspecified abnormal findings in urine: Secondary | ICD-10-CM | POA: Diagnosis not present

## 2022-07-30 DIAGNOSIS — R7309 Other abnormal glucose: Secondary | ICD-10-CM | POA: Diagnosis not present

## 2022-07-30 DIAGNOSIS — R81 Glycosuria: Secondary | ICD-10-CM | POA: Diagnosis not present

## 2022-09-06 DIAGNOSIS — F900 Attention-deficit hyperactivity disorder, predominantly inattentive type: Secondary | ICD-10-CM | POA: Diagnosis not present

## 2022-09-06 DIAGNOSIS — F3342 Major depressive disorder, recurrent, in full remission: Secondary | ICD-10-CM | POA: Diagnosis not present

## 2022-09-09 DIAGNOSIS — R413 Other amnesia: Secondary | ICD-10-CM | POA: Diagnosis not present

## 2022-09-09 DIAGNOSIS — R7303 Prediabetes: Secondary | ICD-10-CM | POA: Diagnosis not present

## 2022-09-10 ENCOUNTER — Other Ambulatory Visit: Payer: Self-pay | Admitting: Physician Assistant

## 2022-09-10 DIAGNOSIS — R413 Other amnesia: Secondary | ICD-10-CM

## 2022-09-29 ENCOUNTER — Ambulatory Visit
Admission: RE | Admit: 2022-09-29 | Discharge: 2022-09-29 | Disposition: A | Discharge status: Home / Self Care | Payer: BC Managed Care – PPO | Source: Ambulatory Visit | Attending: Physician Assistant | Admitting: Physician Assistant

## 2022-09-29 DIAGNOSIS — R413 Other amnesia: Secondary | ICD-10-CM

## 2022-09-29 MED ORDER — GADOPICLENOL 0.5 MMOL/ML IV SOLN
8.0000 mL | Freq: Once | INTRAVENOUS | Status: AC | PRN
  Administered 2022-09-29: 8 mL via INTRAVENOUS

## 2022-10-01 DIAGNOSIS — M25552 Pain in left hip: Secondary | ICD-10-CM | POA: Diagnosis not present

## 2022-10-06 ENCOUNTER — Other Ambulatory Visit: Payer: No Typology Code available for payment source

## 2023-08-22 IMAGING — US US ABDOMEN COMPLETE
1 series · 13 of 25 positions shown · non-contrast
Comparison: 10/13/2016

CLINICAL DATA: Hepatic steatosis, history thyroid cancer,
hyperlipidemia

EXAM:
ABDOMEN ULTRASOUND COMPLETE

[Series 1: us abdomen complete · 0.26mm/px · 13 of 72 slices shown]
[im 1/72]
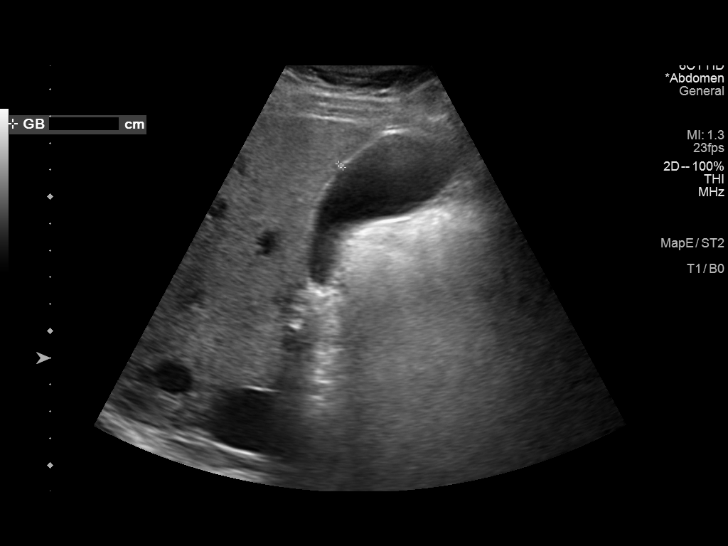
[im 6/72]
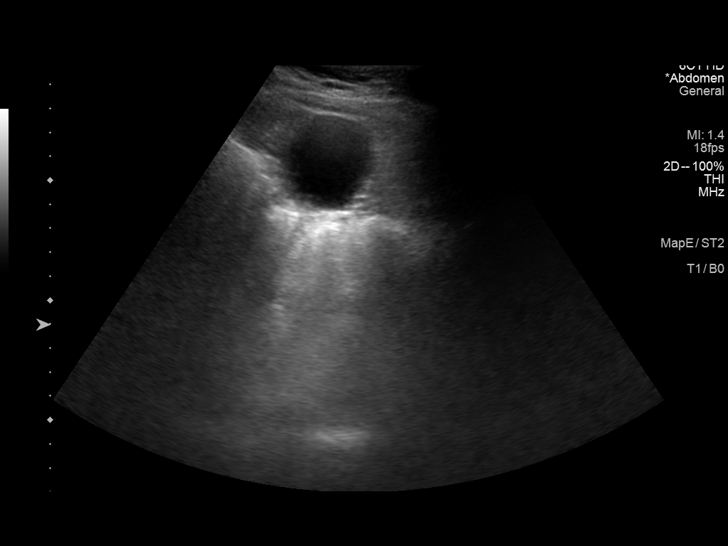
[im 12/72]
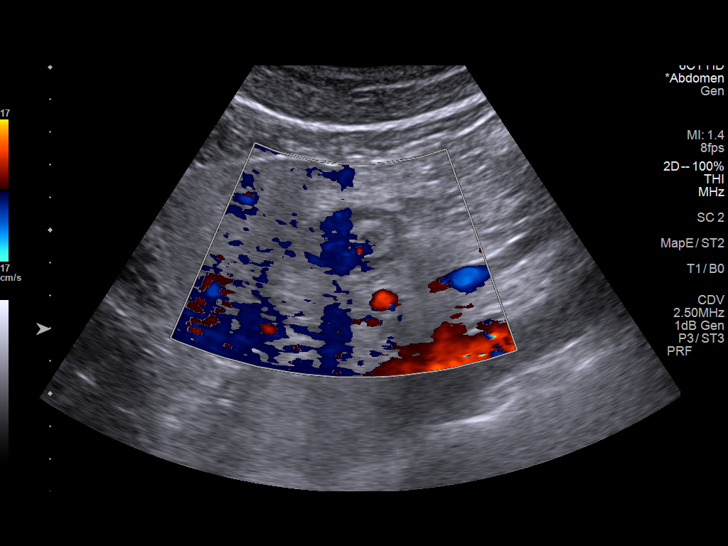
[im 18/72]
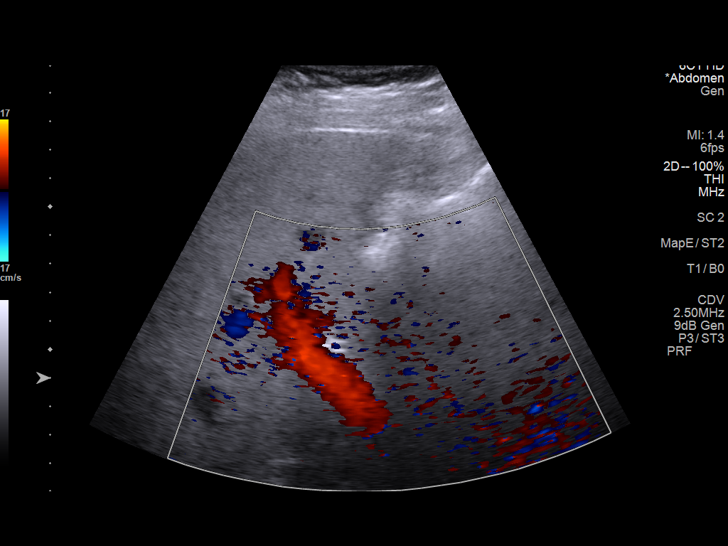
[im 24/72]
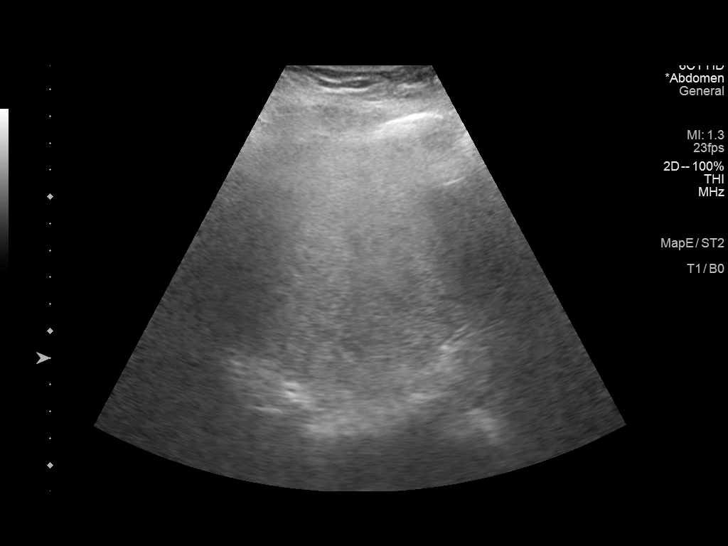
[im 30/72]
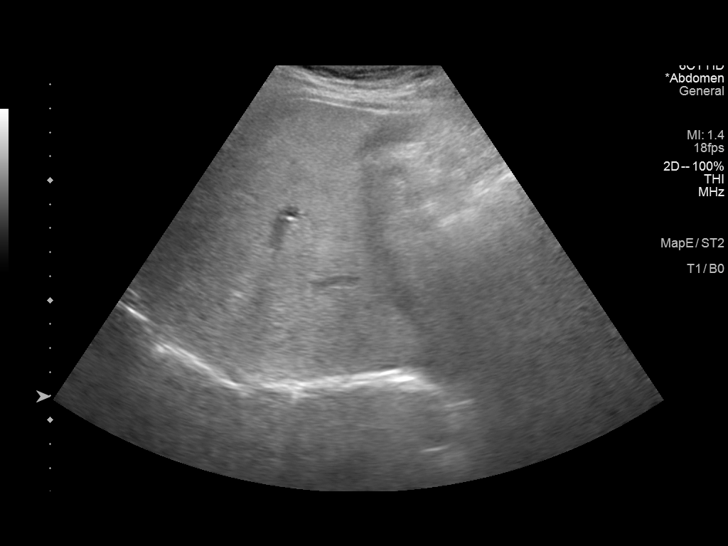
[im 36/72]
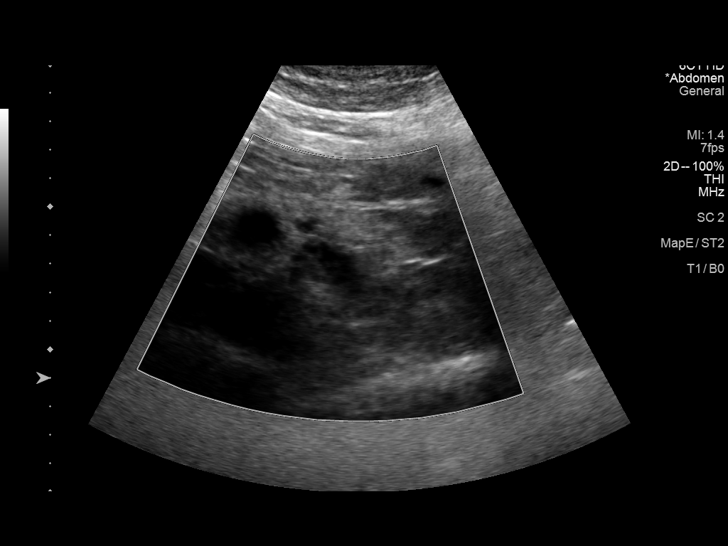
[im 42/72]
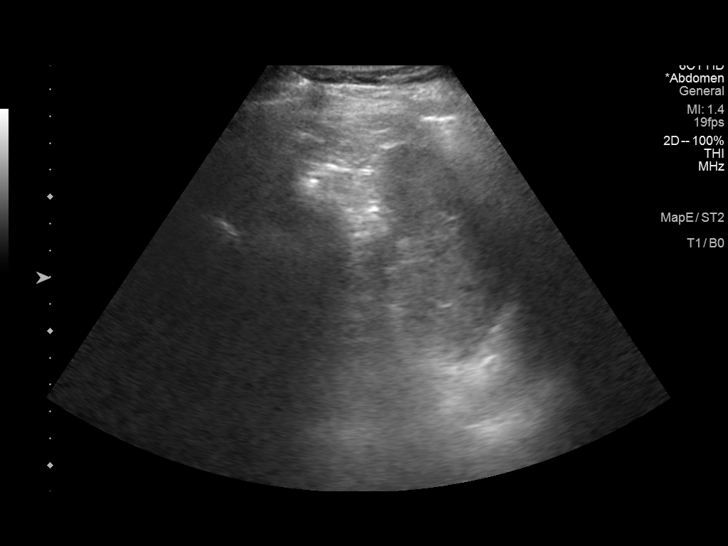
[im 48/72]
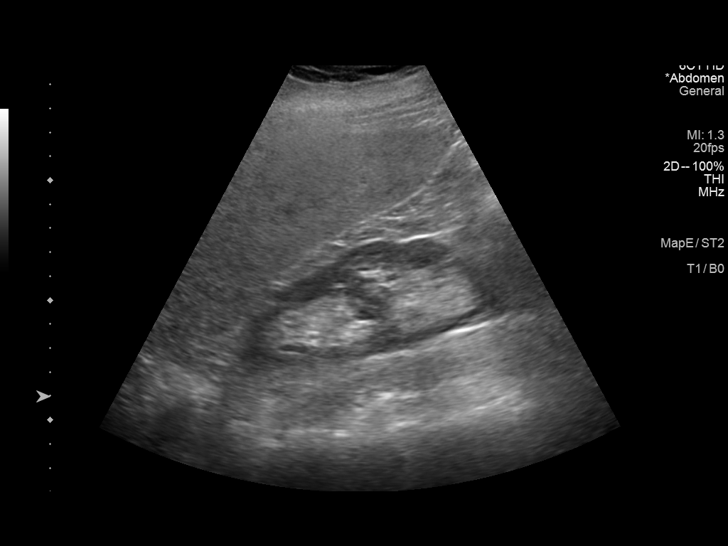
[im 54/72]
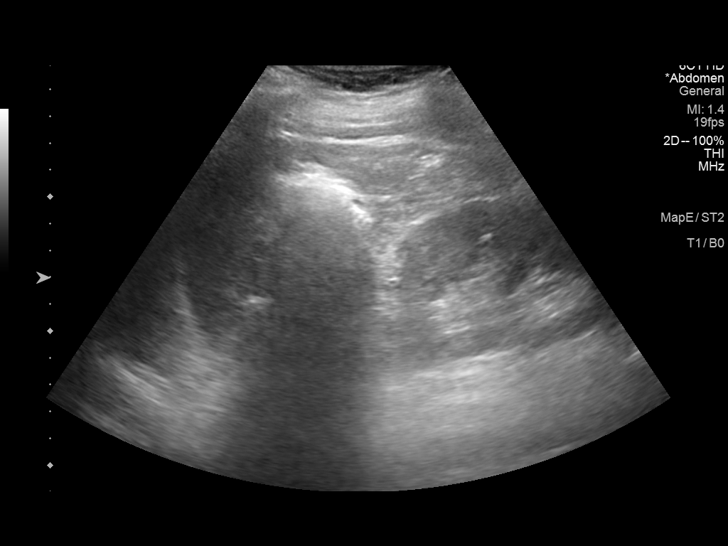
[im 60/72]
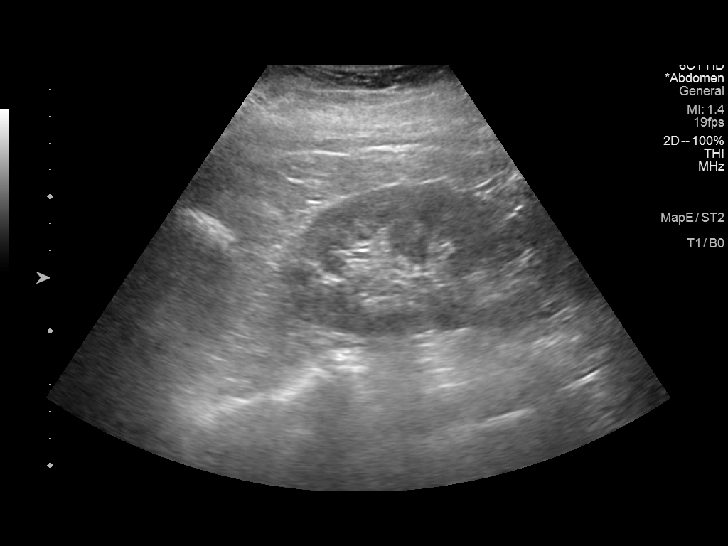
[im 66/72]
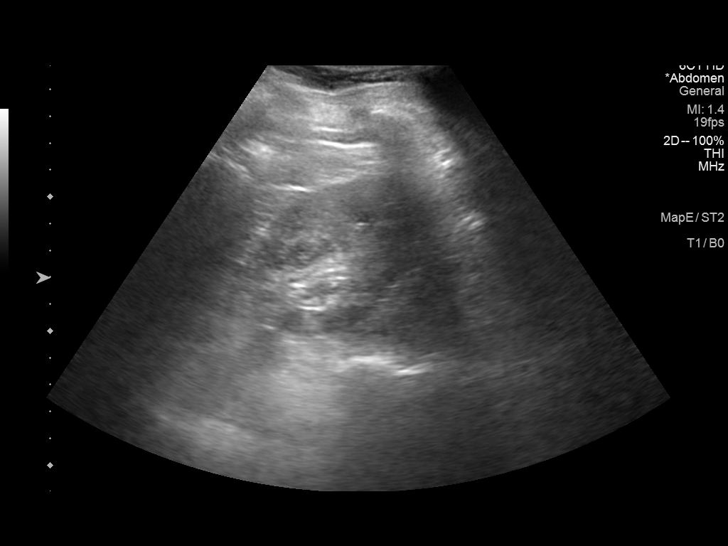
[im 72/72]
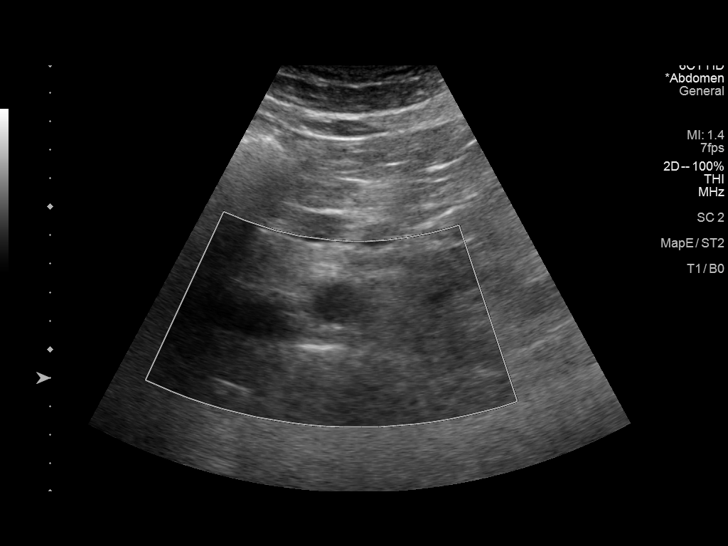

[13 of 25 positions shown; findings below may reference images not displayed]

FINDINGS: Gallbladder: Normally distended without stones or wall thickening.
No pericholecystic fluid or sonographic Murphy sign.

Common bile duct: Diameter: 4 mm, normal

Liver: Echogenic parenchyma, likely fatty infiltration though this
can be seen with cirrhosis and certain infiltrative disorders. No
hepatic mass or nodularity. No intrahepatic biliary dilatation.
Portal vein is patent on color Doppler imaging with normal direction
of blood flow towards the liver.

IVC: Normal appearance

Pancreas: Normal appearance

Spleen: 9 suboptimally seen due to bowel gas, no gross abnormality,
measures 4.4 cm length

Right Kidney: Length: 12.6 cm. Normal cortical echogenicity. Diffuse
cortical thinning. No mass or hydronephrosis.

Left Kidney: Length: 12.0 cm. Mild cortical thinning. Upper normal
cortical echogenicity. No mass or hydronephrosis.

Abdominal aorta: Visualized portion normal caliber, bifurcation
obscured by bowel gas.

Other findings: No free fluid
IMPRESSION: Probable fatty infiltration of liver as above.

No definite upper abdominal sonographic abnormalities identified.

## 2023-10-05 ENCOUNTER — Other Ambulatory Visit: Payer: Self-pay | Admitting: Obstetrics and Gynecology

## 2023-10-05 DIAGNOSIS — Z1231 Encounter for screening mammogram for malignant neoplasm of breast: Secondary | ICD-10-CM

## 2023-10-10 ENCOUNTER — Other Ambulatory Visit (HOSPITAL_COMMUNITY): Payer: Self-pay | Admitting: Physician Assistant

## 2023-10-10 DIAGNOSIS — K449 Diaphragmatic hernia without obstruction or gangrene: Secondary | ICD-10-CM

## 2023-10-12 ENCOUNTER — Ambulatory Visit: Payer: Self-pay

## 2023-11-02 ENCOUNTER — Ambulatory Visit
Admission: RE | Admit: 2023-11-02 | Discharge: 2023-11-02 | Disposition: A | Discharge status: Home / Self Care | Source: Ambulatory Visit | Attending: Obstetrics and Gynecology | Admitting: Obstetrics and Gynecology

## 2023-11-02 DIAGNOSIS — Z1231 Encounter for screening mammogram for malignant neoplasm of breast: Secondary | ICD-10-CM

## 2023-11-04 ENCOUNTER — Ambulatory Visit (HOSPITAL_COMMUNITY)
Admission: RE | Admit: 2023-11-04 | Discharge: 2023-11-04 | Disposition: A | Discharge status: Home / Self Care | Source: Ambulatory Visit | Attending: Physician Assistant | Admitting: Physician Assistant

## 2023-11-04 DIAGNOSIS — K449 Diaphragmatic hernia without obstruction or gangrene: Secondary | ICD-10-CM | POA: Insufficient documentation

## 2023-11-08 ENCOUNTER — Other Ambulatory Visit: Payer: Self-pay | Admitting: Obstetrics and Gynecology

## 2023-11-08 DIAGNOSIS — R928 Other abnormal and inconclusive findings on diagnostic imaging of breast: Secondary | ICD-10-CM

## 2023-11-11 ENCOUNTER — Ambulatory Visit
Admission: RE | Admit: 2023-11-11 | Discharge: 2023-11-11 | Disposition: A | Discharge status: Home / Self Care | Source: Ambulatory Visit | Attending: Obstetrics and Gynecology | Admitting: Obstetrics and Gynecology

## 2023-11-11 DIAGNOSIS — R928 Other abnormal and inconclusive findings on diagnostic imaging of breast: Secondary | ICD-10-CM
